# Patient Record
Sex: Female | Born: 1940
Health system: Southern US, Community
[De-identification: ages and names within clinical notes are randomized; demographics above are authoritative.]

## PROBLEM LIST (undated history)

## (undated) DIAGNOSIS — I4891 Unspecified atrial fibrillation: Secondary | ICD-10-CM

## (undated) DIAGNOSIS — I10 Essential (primary) hypertension: Secondary | ICD-10-CM

## (undated) DIAGNOSIS — I509 Heart failure, unspecified: Secondary | ICD-10-CM

## (undated) HISTORY — PX: FOOT SURGERY: SHX648

## (undated) HISTORY — PX: ABDOMINAL HYSTERECTOMY: SHX81

## (undated) HISTORY — PX: TUBAL LIGATION: SHX77

---

## 2004-03-26 ENCOUNTER — Ambulatory Visit: Payer: Self-pay | Admitting: Family Medicine

## 2004-04-23 ENCOUNTER — Ambulatory Visit: Payer: Self-pay | Admitting: Family Medicine

## 2004-05-22 ENCOUNTER — Ambulatory Visit: Payer: Self-pay | Admitting: Family Medicine

## 2004-05-29 ENCOUNTER — Ambulatory Visit: Payer: Self-pay | Admitting: Family Medicine

## 2004-06-06 ENCOUNTER — Ambulatory Visit: Payer: Self-pay | Admitting: Family Medicine

## 2004-07-07 ENCOUNTER — Ambulatory Visit: Payer: Self-pay | Admitting: Family Medicine

## 2004-07-14 ENCOUNTER — Ambulatory Visit: Payer: Self-pay | Admitting: Family Medicine

## 2004-07-21 ENCOUNTER — Ambulatory Visit: Payer: Self-pay | Admitting: Family Medicine

## 2004-07-28 ENCOUNTER — Ambulatory Visit: Payer: Self-pay | Admitting: Family Medicine

## 2004-08-11 ENCOUNTER — Ambulatory Visit: Payer: Self-pay | Admitting: Family Medicine

## 2004-08-18 ENCOUNTER — Ambulatory Visit: Payer: Self-pay | Admitting: Family Medicine

## 2004-09-15 ENCOUNTER — Ambulatory Visit: Payer: Self-pay | Admitting: Family Medicine

## 2004-09-30 ENCOUNTER — Ambulatory Visit: Payer: Self-pay | Admitting: Family Medicine

## 2004-10-14 ENCOUNTER — Ambulatory Visit: Payer: Self-pay | Admitting: Family Medicine

## 2004-10-17 ENCOUNTER — Ambulatory Visit: Payer: Self-pay | Admitting: Family Medicine

## 2004-10-24 ENCOUNTER — Ambulatory Visit: Payer: Self-pay | Admitting: Family Medicine

## 2004-11-20 ENCOUNTER — Ambulatory Visit: Payer: Self-pay | Admitting: Family Medicine

## 2004-11-27 ENCOUNTER — Ambulatory Visit: Payer: Self-pay | Admitting: Family Medicine

## 2004-12-11 ENCOUNTER — Ambulatory Visit: Payer: Self-pay | Admitting: Family Medicine

## 2004-12-24 ENCOUNTER — Ambulatory Visit: Payer: Self-pay | Admitting: Family Medicine

## 2005-01-12 ENCOUNTER — Ambulatory Visit: Payer: Self-pay | Admitting: Family Medicine

## 2005-01-26 ENCOUNTER — Ambulatory Visit: Payer: Self-pay | Admitting: Family Medicine

## 2005-02-27 ENCOUNTER — Ambulatory Visit: Payer: Self-pay | Admitting: Family Medicine

## 2005-03-04 ENCOUNTER — Ambulatory Visit: Payer: Self-pay | Admitting: Family Medicine

## 2005-03-19 ENCOUNTER — Ambulatory Visit: Payer: Self-pay | Admitting: Family Medicine

## 2005-04-02 ENCOUNTER — Ambulatory Visit: Payer: Self-pay | Admitting: Family Medicine

## 2005-04-16 ENCOUNTER — Ambulatory Visit: Payer: Self-pay | Admitting: Family Medicine

## 2005-05-08 ENCOUNTER — Ambulatory Visit: Payer: Self-pay | Admitting: Family Medicine

## 2005-06-09 ENCOUNTER — Ambulatory Visit: Payer: Self-pay | Admitting: Family Medicine

## 2005-06-24 ENCOUNTER — Ambulatory Visit: Payer: Self-pay | Admitting: Family Medicine

## 2005-07-14 ENCOUNTER — Ambulatory Visit: Payer: Self-pay | Admitting: Family Medicine

## 2005-08-12 ENCOUNTER — Ambulatory Visit: Payer: Self-pay | Admitting: Family Medicine

## 2011-05-23 DIAGNOSIS — J0100 Acute maxillary sinusitis, unspecified: Secondary | ICD-10-CM | POA: Diagnosis not present

## 2011-06-17 DIAGNOSIS — J370 Chronic laryngitis: Secondary | ICD-10-CM | POA: Diagnosis not present

## 2011-06-17 DIAGNOSIS — I951 Orthostatic hypotension: Secondary | ICD-10-CM | POA: Diagnosis not present

## 2011-06-24 DIAGNOSIS — Z1331 Encounter for screening for depression: Secondary | ICD-10-CM | POA: Diagnosis not present

## 2011-06-24 DIAGNOSIS — I951 Orthostatic hypotension: Secondary | ICD-10-CM | POA: Diagnosis not present

## 2011-07-06 DIAGNOSIS — Z0000 Encounter for general adult medical examination without abnormal findings: Secondary | ICD-10-CM | POA: Diagnosis not present

## 2011-07-16 DIAGNOSIS — D689 Coagulation defect, unspecified: Secondary | ICD-10-CM | POA: Diagnosis not present

## 2011-07-16 DIAGNOSIS — R002 Palpitations: Secondary | ICD-10-CM | POA: Diagnosis not present

## 2011-07-16 DIAGNOSIS — Z7901 Long term (current) use of anticoagulants: Secondary | ICD-10-CM | POA: Diagnosis not present

## 2011-07-16 DIAGNOSIS — I472 Ventricular tachycardia: Secondary | ICD-10-CM | POA: Diagnosis not present

## 2011-07-16 DIAGNOSIS — I4891 Unspecified atrial fibrillation: Secondary | ICD-10-CM | POA: Diagnosis not present

## 2011-07-16 DIAGNOSIS — E7800 Pure hypercholesterolemia, unspecified: Secondary | ICD-10-CM | POA: Diagnosis not present

## 2011-07-20 DIAGNOSIS — Z1211 Encounter for screening for malignant neoplasm of colon: Secondary | ICD-10-CM | POA: Diagnosis not present

## 2011-07-20 DIAGNOSIS — G479 Sleep disorder, unspecified: Secondary | ICD-10-CM | POA: Diagnosis not present

## 2011-07-20 DIAGNOSIS — M2550 Pain in unspecified joint: Secondary | ICD-10-CM | POA: Diagnosis not present

## 2011-07-20 DIAGNOSIS — Z13820 Encounter for screening for osteoporosis: Secondary | ICD-10-CM | POA: Diagnosis not present

## 2011-07-20 DIAGNOSIS — J209 Acute bronchitis, unspecified: Secondary | ICD-10-CM | POA: Diagnosis not present

## 2011-10-22 DIAGNOSIS — S50909A Unspecified superficial injury of unspecified elbow, initial encounter: Secondary | ICD-10-CM | POA: Diagnosis not present

## 2011-10-22 DIAGNOSIS — Z23 Encounter for immunization: Secondary | ICD-10-CM | POA: Diagnosis not present

## 2011-10-23 DIAGNOSIS — H35039 Hypertensive retinopathy, unspecified eye: Secondary | ICD-10-CM | POA: Diagnosis not present

## 2011-10-23 DIAGNOSIS — H40019 Open angle with borderline findings, low risk, unspecified eye: Secondary | ICD-10-CM | POA: Diagnosis not present

## 2011-10-23 DIAGNOSIS — H2510 Age-related nuclear cataract, unspecified eye: Secondary | ICD-10-CM | POA: Diagnosis not present

## 2012-01-21 DIAGNOSIS — I472 Ventricular tachycardia: Secondary | ICD-10-CM | POA: Diagnosis not present

## 2012-01-21 DIAGNOSIS — I4891 Unspecified atrial fibrillation: Secondary | ICD-10-CM | POA: Diagnosis not present

## 2012-03-07 DIAGNOSIS — R062 Wheezing: Secondary | ICD-10-CM | POA: Diagnosis not present

## 2012-03-07 DIAGNOSIS — J209 Acute bronchitis, unspecified: Secondary | ICD-10-CM | POA: Diagnosis not present

## 2012-03-07 DIAGNOSIS — J029 Acute pharyngitis, unspecified: Secondary | ICD-10-CM | POA: Diagnosis not present

## 2012-07-13 DIAGNOSIS — R1011 Right upper quadrant pain: Secondary | ICD-10-CM | POA: Diagnosis not present

## 2012-07-13 DIAGNOSIS — R5381 Other malaise: Secondary | ICD-10-CM | POA: Diagnosis not present

## 2012-07-13 DIAGNOSIS — I890 Lymphedema, not elsewhere classified: Secondary | ICD-10-CM | POA: Diagnosis not present

## 2012-08-03 DIAGNOSIS — R062 Wheezing: Secondary | ICD-10-CM | POA: Diagnosis not present

## 2012-08-08 DIAGNOSIS — M818 Other osteoporosis without current pathological fracture: Secondary | ICD-10-CM | POA: Diagnosis present

## 2012-08-08 DIAGNOSIS — Z886 Allergy status to analgesic agent status: Secondary | ICD-10-CM | POA: Diagnosis not present

## 2012-08-08 DIAGNOSIS — I509 Heart failure, unspecified: Secondary | ICD-10-CM | POA: Diagnosis not present

## 2012-08-08 DIAGNOSIS — I428 Other cardiomyopathies: Secondary | ICD-10-CM | POA: Diagnosis present

## 2012-08-08 DIAGNOSIS — Z79899 Other long term (current) drug therapy: Secondary | ICD-10-CM | POA: Diagnosis not present

## 2012-08-08 DIAGNOSIS — Z7982 Long term (current) use of aspirin: Secondary | ICD-10-CM | POA: Diagnosis not present

## 2012-08-08 DIAGNOSIS — E785 Hyperlipidemia, unspecified: Secondary | ICD-10-CM | POA: Diagnosis not present

## 2012-08-08 DIAGNOSIS — I059 Rheumatic mitral valve disease, unspecified: Secondary | ICD-10-CM | POA: Diagnosis not present

## 2012-08-08 DIAGNOSIS — I2510 Atherosclerotic heart disease of native coronary artery without angina pectoris: Secondary | ICD-10-CM | POA: Diagnosis not present

## 2012-08-08 DIAGNOSIS — I4891 Unspecified atrial fibrillation: Secondary | ICD-10-CM | POA: Diagnosis not present

## 2012-08-08 DIAGNOSIS — Z8249 Family history of ischemic heart disease and other diseases of the circulatory system: Secondary | ICD-10-CM | POA: Diagnosis not present

## 2012-08-08 DIAGNOSIS — E7800 Pure hypercholesterolemia, unspecified: Secondary | ICD-10-CM | POA: Diagnosis not present

## 2012-08-08 DIAGNOSIS — I501 Left ventricular failure: Secondary | ICD-10-CM | POA: Diagnosis not present

## 2012-08-10 DIAGNOSIS — Z79899 Other long term (current) drug therapy: Secondary | ICD-10-CM | POA: Diagnosis not present

## 2012-08-10 DIAGNOSIS — I2510 Atherosclerotic heart disease of native coronary artery without angina pectoris: Secondary | ICD-10-CM | POA: Diagnosis not present

## 2012-08-10 DIAGNOSIS — I509 Heart failure, unspecified: Secondary | ICD-10-CM | POA: Diagnosis not present

## 2012-08-10 DIAGNOSIS — I428 Other cardiomyopathies: Secondary | ICD-10-CM | POA: Diagnosis not present

## 2012-08-10 DIAGNOSIS — I059 Rheumatic mitral valve disease, unspecified: Secondary | ICD-10-CM | POA: Diagnosis not present

## 2012-08-10 DIAGNOSIS — I4891 Unspecified atrial fibrillation: Secondary | ICD-10-CM | POA: Diagnosis not present

## 2012-08-10 DIAGNOSIS — I501 Left ventricular failure: Secondary | ICD-10-CM | POA: Diagnosis not present

## 2012-08-11 DIAGNOSIS — I509 Heart failure, unspecified: Secondary | ICD-10-CM | POA: Diagnosis not present

## 2012-08-11 DIAGNOSIS — I2510 Atherosclerotic heart disease of native coronary artery without angina pectoris: Secondary | ICD-10-CM | POA: Diagnosis not present

## 2012-08-11 DIAGNOSIS — I428 Other cardiomyopathies: Secondary | ICD-10-CM | POA: Diagnosis not present

## 2012-08-11 DIAGNOSIS — I059 Rheumatic mitral valve disease, unspecified: Secondary | ICD-10-CM | POA: Diagnosis not present

## 2012-08-11 DIAGNOSIS — Z79899 Other long term (current) drug therapy: Secondary | ICD-10-CM | POA: Diagnosis not present

## 2012-08-11 DIAGNOSIS — I4891 Unspecified atrial fibrillation: Secondary | ICD-10-CM | POA: Diagnosis not present

## 2012-08-18 DIAGNOSIS — I428 Other cardiomyopathies: Secondary | ICD-10-CM | POA: Diagnosis not present

## 2012-08-18 DIAGNOSIS — D689 Coagulation defect, unspecified: Secondary | ICD-10-CM | POA: Diagnosis not present

## 2012-08-18 DIAGNOSIS — R002 Palpitations: Secondary | ICD-10-CM | POA: Diagnosis not present

## 2012-08-18 DIAGNOSIS — I4891 Unspecified atrial fibrillation: Secondary | ICD-10-CM | POA: Diagnosis not present

## 2012-08-29 DIAGNOSIS — M79609 Pain in unspecified limb: Secondary | ICD-10-CM | POA: Diagnosis not present

## 2012-08-29 DIAGNOSIS — I82819 Embolism and thrombosis of superficial veins of unspecified lower extremities: Secondary | ICD-10-CM | POA: Diagnosis not present

## 2012-09-21 DIAGNOSIS — I4891 Unspecified atrial fibrillation: Secondary | ICD-10-CM | POA: Diagnosis not present

## 2012-09-21 DIAGNOSIS — R002 Palpitations: Secondary | ICD-10-CM | POA: Diagnosis not present

## 2012-09-21 DIAGNOSIS — Z79899 Other long term (current) drug therapy: Secondary | ICD-10-CM | POA: Diagnosis not present

## 2012-09-21 DIAGNOSIS — Z7901 Long term (current) use of anticoagulants: Secondary | ICD-10-CM | POA: Diagnosis not present

## 2012-09-21 DIAGNOSIS — I428 Other cardiomyopathies: Secondary | ICD-10-CM | POA: Diagnosis not present

## 2013-01-25 DIAGNOSIS — I4891 Unspecified atrial fibrillation: Secondary | ICD-10-CM | POA: Diagnosis not present

## 2013-01-25 DIAGNOSIS — R002 Palpitations: Secondary | ICD-10-CM | POA: Diagnosis not present

## 2013-01-25 DIAGNOSIS — R52 Pain, unspecified: Secondary | ICD-10-CM | POA: Diagnosis not present

## 2013-01-25 DIAGNOSIS — I428 Other cardiomyopathies: Secondary | ICD-10-CM | POA: Diagnosis not present

## 2013-01-25 DIAGNOSIS — I472 Ventricular tachycardia: Secondary | ICD-10-CM | POA: Diagnosis not present

## 2013-02-06 DIAGNOSIS — Z1389 Encounter for screening for other disorder: Secondary | ICD-10-CM | POA: Diagnosis not present

## 2013-02-06 DIAGNOSIS — IMO0002 Reserved for concepts with insufficient information to code with codable children: Secondary | ICD-10-CM | POA: Diagnosis not present

## 2013-02-06 DIAGNOSIS — J0100 Acute maxillary sinusitis, unspecified: Secondary | ICD-10-CM | POA: Diagnosis not present

## 2013-02-10 ENCOUNTER — Emergency Department (HOSPITAL_COMMUNITY): Payer: No Typology Code available for payment source

## 2013-02-10 ENCOUNTER — Emergency Department (HOSPITAL_COMMUNITY)
Admission: EM | Admit: 2013-02-10 | Discharge: 2013-02-10 | Disposition: A | Discharge status: Home / Self Care | Payer: No Typology Code available for payment source | Attending: Emergency Medicine | Admitting: Emergency Medicine

## 2013-02-10 ENCOUNTER — Encounter (HOSPITAL_COMMUNITY): Payer: Self-pay | Admitting: Emergency Medicine

## 2013-02-10 DIAGNOSIS — Z79899 Other long term (current) drug therapy: Secondary | ICD-10-CM | POA: Insufficient documentation

## 2013-02-10 DIAGNOSIS — S0993XA Unspecified injury of face, initial encounter: Secondary | ICD-10-CM | POA: Diagnosis not present

## 2013-02-10 DIAGNOSIS — I4891 Unspecified atrial fibrillation: Secondary | ICD-10-CM | POA: Insufficient documentation

## 2013-02-10 DIAGNOSIS — S0990XA Unspecified injury of head, initial encounter: Secondary | ICD-10-CM | POA: Diagnosis not present

## 2013-02-10 DIAGNOSIS — Y9389 Activity, other specified: Secondary | ICD-10-CM | POA: Insufficient documentation

## 2013-02-10 DIAGNOSIS — Z7982 Long term (current) use of aspirin: Secondary | ICD-10-CM | POA: Insufficient documentation

## 2013-02-10 DIAGNOSIS — S139XXA Sprain of joints and ligaments of unspecified parts of neck, initial encounter: Secondary | ICD-10-CM | POA: Insufficient documentation

## 2013-02-10 DIAGNOSIS — S298XXA Other specified injuries of thorax, initial encounter: Secondary | ICD-10-CM | POA: Diagnosis not present

## 2013-02-10 DIAGNOSIS — Y92410 Unspecified street and highway as the place of occurrence of the external cause: Secondary | ICD-10-CM | POA: Insufficient documentation

## 2013-02-10 DIAGNOSIS — M542 Cervicalgia: Secondary | ICD-10-CM | POA: Diagnosis not present

## 2013-02-10 DIAGNOSIS — R51 Headache: Secondary | ICD-10-CM | POA: Diagnosis not present

## 2013-02-10 DIAGNOSIS — I10 Essential (primary) hypertension: Secondary | ICD-10-CM | POA: Insufficient documentation

## 2013-02-10 DIAGNOSIS — S161XXA Strain of muscle, fascia and tendon at neck level, initial encounter: Secondary | ICD-10-CM

## 2013-02-10 DIAGNOSIS — I509 Heart failure, unspecified: Secondary | ICD-10-CM | POA: Insufficient documentation

## 2013-02-10 HISTORY — DX: Essential (primary) hypertension: I10

## 2013-02-10 HISTORY — DX: Unspecified atrial fibrillation: I48.91

## 2013-02-10 HISTORY — DX: Heart failure, unspecified: I50.9
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
## 2013-03-23 DIAGNOSIS — H9190 Unspecified hearing loss, unspecified ear: Secondary | ICD-10-CM | POA: Diagnosis not present

## 2013-04-20 DIAGNOSIS — H905 Unspecified sensorineural hearing loss: Secondary | ICD-10-CM | POA: Diagnosis not present

## 2013-04-27 DIAGNOSIS — H35359 Cystoid macular degeneration, unspecified eye: Secondary | ICD-10-CM | POA: Diagnosis not present

## 2013-05-30 DIAGNOSIS — H35379 Puckering of macula, unspecified eye: Secondary | ICD-10-CM | POA: Diagnosis not present

## 2013-08-16 DIAGNOSIS — I428 Other cardiomyopathies: Secondary | ICD-10-CM | POA: Diagnosis not present

## 2013-08-16 DIAGNOSIS — I4891 Unspecified atrial fibrillation: Secondary | ICD-10-CM | POA: Diagnosis not present

## 2013-08-16 DIAGNOSIS — Z79899 Other long term (current) drug therapy: Secondary | ICD-10-CM | POA: Diagnosis not present

## 2013-08-16 DIAGNOSIS — I472 Ventricular tachycardia: Secondary | ICD-10-CM | POA: Diagnosis not present

## 2013-08-16 DIAGNOSIS — I4729 Other ventricular tachycardia: Secondary | ICD-10-CM | POA: Diagnosis not present

## 2013-08-29 DIAGNOSIS — H35379 Puckering of macula, unspecified eye: Secondary | ICD-10-CM | POA: Diagnosis not present

## 2013-09-01 DIAGNOSIS — I428 Other cardiomyopathies: Secondary | ICD-10-CM | POA: Diagnosis not present

## 2013-09-01 DIAGNOSIS — I4891 Unspecified atrial fibrillation: Secondary | ICD-10-CM | POA: Diagnosis not present

## 2013-09-20 DIAGNOSIS — R002 Palpitations: Secondary | ICD-10-CM | POA: Diagnosis not present

## 2013-09-20 DIAGNOSIS — I428 Other cardiomyopathies: Secondary | ICD-10-CM | POA: Diagnosis not present

## 2013-09-20 DIAGNOSIS — I4891 Unspecified atrial fibrillation: Secondary | ICD-10-CM | POA: Diagnosis not present

## 2013-09-26 DIAGNOSIS — L0390 Cellulitis, unspecified: Secondary | ICD-10-CM | POA: Diagnosis not present

## 2013-09-26 DIAGNOSIS — IMO0002 Reserved for concepts with insufficient information to code with codable children: Secondary | ICD-10-CM | POA: Diagnosis not present

## 2013-09-26 DIAGNOSIS — L0291 Cutaneous abscess, unspecified: Secondary | ICD-10-CM | POA: Diagnosis not present

## 2013-11-13 DIAGNOSIS — IMO0002 Reserved for concepts with insufficient information to code with codable children: Secondary | ICD-10-CM | POA: Diagnosis not present

## 2013-11-13 DIAGNOSIS — R1904 Left lower quadrant abdominal swelling, mass and lump: Secondary | ICD-10-CM | POA: Diagnosis not present

## 2013-11-16 DIAGNOSIS — Z9089 Acquired absence of other organs: Secondary | ICD-10-CM | POA: Diagnosis not present

## 2013-11-16 DIAGNOSIS — R109 Unspecified abdominal pain: Secondary | ICD-10-CM | POA: Diagnosis not present

## 2013-11-16 DIAGNOSIS — R1904 Left lower quadrant abdominal swelling, mass and lump: Secondary | ICD-10-CM | POA: Diagnosis not present

## 2013-11-20 DIAGNOSIS — Z1331 Encounter for screening for depression: Secondary | ICD-10-CM | POA: Diagnosis not present

## 2013-11-20 DIAGNOSIS — R1904 Left lower quadrant abdominal swelling, mass and lump: Secondary | ICD-10-CM | POA: Diagnosis not present

## 2013-11-20 DIAGNOSIS — Z139 Encounter for screening, unspecified: Secondary | ICD-10-CM | POA: Diagnosis not present

## 2013-11-20 DIAGNOSIS — Z1211 Encounter for screening for malignant neoplasm of colon: Secondary | ICD-10-CM | POA: Diagnosis not present

## 2014-01-02 DIAGNOSIS — Z1231 Encounter for screening mammogram for malignant neoplasm of breast: Secondary | ICD-10-CM | POA: Diagnosis not present

## 2014-01-02 DIAGNOSIS — IMO0002 Reserved for concepts with insufficient information to code with codable children: Secondary | ICD-10-CM | POA: Diagnosis not present

## 2014-01-02 DIAGNOSIS — E785 Hyperlipidemia, unspecified: Secondary | ICD-10-CM | POA: Diagnosis not present

## 2014-01-02 DIAGNOSIS — I428 Other cardiomyopathies: Secondary | ICD-10-CM | POA: Diagnosis not present

## 2014-01-02 DIAGNOSIS — I4891 Unspecified atrial fibrillation: Secondary | ICD-10-CM | POA: Diagnosis not present

## 2014-01-02 DIAGNOSIS — Z13220 Encounter for screening for lipoid disorders: Secondary | ICD-10-CM | POA: Diagnosis not present

## 2014-01-02 DIAGNOSIS — Z131 Encounter for screening for diabetes mellitus: Secondary | ICD-10-CM | POA: Diagnosis not present

## 2014-01-17 DIAGNOSIS — IMO0002 Reserved for concepts with insufficient information to code with codable children: Secondary | ICD-10-CM | POA: Diagnosis not present

## 2014-01-17 DIAGNOSIS — R1900 Intra-abdominal and pelvic swelling, mass and lump, unspecified site: Secondary | ICD-10-CM | POA: Diagnosis not present

## 2014-01-23 DIAGNOSIS — H2510 Age-related nuclear cataract, unspecified eye: Secondary | ICD-10-CM | POA: Diagnosis not present

## 2014-01-23 DIAGNOSIS — H04129 Dry eye syndrome of unspecified lacrimal gland: Secondary | ICD-10-CM | POA: Diagnosis not present

## 2014-01-23 DIAGNOSIS — H40019 Open angle with borderline findings, low risk, unspecified eye: Secondary | ICD-10-CM | POA: Diagnosis not present

## 2014-01-24 DIAGNOSIS — R1900 Intra-abdominal and pelvic swelling, mass and lump, unspecified site: Secondary | ICD-10-CM | POA: Diagnosis not present

## 2014-01-24 DIAGNOSIS — Z90710 Acquired absence of both cervix and uterus: Secondary | ICD-10-CM | POA: Diagnosis not present

## 2014-01-24 DIAGNOSIS — R1909 Other intra-abdominal and pelvic swelling, mass and lump: Secondary | ICD-10-CM | POA: Diagnosis not present

## 2014-01-31 DIAGNOSIS — R1904 Left lower quadrant abdominal swelling, mass and lump: Secondary | ICD-10-CM | POA: Diagnosis not present

## 2014-01-31 DIAGNOSIS — IMO0002 Reserved for concepts with insufficient information to code with codable children: Secondary | ICD-10-CM | POA: Diagnosis not present

## 2014-02-13 DIAGNOSIS — H2510 Age-related nuclear cataract, unspecified eye: Secondary | ICD-10-CM | POA: Diagnosis not present

## 2014-02-13 DIAGNOSIS — Z7982 Long term (current) use of aspirin: Secondary | ICD-10-CM | POA: Diagnosis not present

## 2014-02-13 DIAGNOSIS — H268 Other specified cataract: Secondary | ICD-10-CM | POA: Diagnosis not present

## 2014-02-13 DIAGNOSIS — Z79899 Other long term (current) drug therapy: Secondary | ICD-10-CM | POA: Diagnosis not present

## 2014-02-13 DIAGNOSIS — I4891 Unspecified atrial fibrillation: Secondary | ICD-10-CM | POA: Diagnosis not present

## 2014-02-13 DIAGNOSIS — I059 Rheumatic mitral valve disease, unspecified: Secondary | ICD-10-CM | POA: Diagnosis not present

## 2014-02-13 DIAGNOSIS — H259 Unspecified age-related cataract: Secondary | ICD-10-CM | POA: Diagnosis not present

## 2014-02-13 DIAGNOSIS — I509 Heart failure, unspecified: Secondary | ICD-10-CM | POA: Diagnosis not present

## 2014-02-14 DIAGNOSIS — H2589 Other age-related cataract: Secondary | ICD-10-CM | POA: Diagnosis not present

## 2014-02-26 DIAGNOSIS — R05 Cough: Secondary | ICD-10-CM | POA: Diagnosis not present

## 2014-02-26 DIAGNOSIS — R1904 Left lower quadrant abdominal swelling, mass and lump: Secondary | ICD-10-CM | POA: Diagnosis not present

## 2014-02-26 DIAGNOSIS — Z6822 Body mass index (BMI) 22.0-22.9, adult: Secondary | ICD-10-CM | POA: Diagnosis not present

## 2014-03-06 DIAGNOSIS — Z6822 Body mass index (BMI) 22.0-22.9, adult: Secondary | ICD-10-CM | POA: Diagnosis not present

## 2014-03-06 DIAGNOSIS — R05 Cough: Secondary | ICD-10-CM | POA: Diagnosis not present

## 2014-03-13 DIAGNOSIS — H02403 Unspecified ptosis of bilateral eyelids: Secondary | ICD-10-CM | POA: Diagnosis not present

## 2014-03-27 DIAGNOSIS — I509 Heart failure, unspecified: Secondary | ICD-10-CM | POA: Diagnosis not present

## 2014-03-27 DIAGNOSIS — H259 Unspecified age-related cataract: Secondary | ICD-10-CM | POA: Diagnosis not present

## 2014-03-27 DIAGNOSIS — H25812 Combined forms of age-related cataract, left eye: Secondary | ICD-10-CM | POA: Diagnosis not present

## 2014-03-27 DIAGNOSIS — I10 Essential (primary) hypertension: Secondary | ICD-10-CM | POA: Diagnosis not present

## 2014-03-27 DIAGNOSIS — Z7982 Long term (current) use of aspirin: Secondary | ICD-10-CM | POA: Diagnosis not present

## 2014-03-27 DIAGNOSIS — H2512 Age-related nuclear cataract, left eye: Secondary | ICD-10-CM | POA: Diagnosis not present

## 2014-03-27 DIAGNOSIS — E785 Hyperlipidemia, unspecified: Secondary | ICD-10-CM | POA: Diagnosis not present

## 2014-03-27 DIAGNOSIS — I4891 Unspecified atrial fibrillation: Secondary | ICD-10-CM | POA: Diagnosis not present

## 2014-03-27 DIAGNOSIS — Z79899 Other long term (current) drug therapy: Secondary | ICD-10-CM | POA: Diagnosis not present
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
## 2014-05-02 DIAGNOSIS — J029 Acute pharyngitis, unspecified: Secondary | ICD-10-CM | POA: Diagnosis not present

## 2014-05-02 DIAGNOSIS — Z6822 Body mass index (BMI) 22.0-22.9, adult: Secondary | ICD-10-CM | POA: Diagnosis not present

## 2014-07-09 DIAGNOSIS — H02403 Unspecified ptosis of bilateral eyelids: Secondary | ICD-10-CM | POA: Diagnosis not present

## 2014-07-20 DIAGNOSIS — H16203 Unspecified keratoconjunctivitis, bilateral: Secondary | ICD-10-CM | POA: Diagnosis not present

## 2014-08-02 DIAGNOSIS — H16299 Other keratoconjunctivitis, unspecified eye: Secondary | ICD-10-CM | POA: Diagnosis not present

## 2014-08-07 DIAGNOSIS — E785 Hyperlipidemia, unspecified: Secondary | ICD-10-CM | POA: Diagnosis not present

## 2014-08-07 DIAGNOSIS — I428 Other cardiomyopathies: Secondary | ICD-10-CM | POA: Diagnosis not present

## 2014-08-07 DIAGNOSIS — I480 Paroxysmal atrial fibrillation: Secondary | ICD-10-CM | POA: Diagnosis not present

## 2014-08-07 DIAGNOSIS — I429 Cardiomyopathy, unspecified: Secondary | ICD-10-CM | POA: Diagnosis not present

## 2014-08-16 DIAGNOSIS — I361 Nonrheumatic tricuspid (valve) insufficiency: Secondary | ICD-10-CM | POA: Diagnosis not present

## 2014-08-16 DIAGNOSIS — I340 Nonrheumatic mitral (valve) insufficiency: Secondary | ICD-10-CM | POA: Diagnosis not present

## 2014-08-16 DIAGNOSIS — I429 Cardiomyopathy, unspecified: Secondary | ICD-10-CM | POA: Diagnosis not present

## 2014-09-17 DIAGNOSIS — H10413 Chronic giant papillary conjunctivitis, bilateral: Secondary | ICD-10-CM | POA: Diagnosis not present

## 2014-09-17 DIAGNOSIS — H04123 Dry eye syndrome of bilateral lacrimal glands: Secondary | ICD-10-CM | POA: Diagnosis not present

## 2014-09-17 DIAGNOSIS — Z961 Presence of intraocular lens: Secondary | ICD-10-CM | POA: Diagnosis not present

## 2014-09-22 DIAGNOSIS — T25122A Burn of first degree of left foot, initial encounter: Secondary | ICD-10-CM | POA: Diagnosis not present

## 2014-09-26 DIAGNOSIS — Z6822 Body mass index (BMI) 22.0-22.9, adult: Secondary | ICD-10-CM | POA: Diagnosis not present

## 2014-09-26 DIAGNOSIS — T25222S Burn of second degree of left foot, sequela: Secondary | ICD-10-CM | POA: Diagnosis not present

## 2014-10-22 DIAGNOSIS — Z6822 Body mass index (BMI) 22.0-22.9, adult: Secondary | ICD-10-CM | POA: Diagnosis not present

## 2014-10-22 DIAGNOSIS — H04123 Dry eye syndrome of bilateral lacrimal glands: Secondary | ICD-10-CM | POA: Diagnosis not present

## 2014-10-22 DIAGNOSIS — J029 Acute pharyngitis, unspecified: Secondary | ICD-10-CM | POA: Diagnosis not present

## 2014-10-22 DIAGNOSIS — Z961 Presence of intraocular lens: Secondary | ICD-10-CM | POA: Diagnosis not present

## 2014-10-22 DIAGNOSIS — H11823 Conjunctivochalasis, bilateral: Secondary | ICD-10-CM | POA: Diagnosis not present

## 2014-10-22 DIAGNOSIS — H35343 Macular cyst, hole, or pseudohole, bilateral: Secondary | ICD-10-CM | POA: Diagnosis not present

## 2014-10-26 DIAGNOSIS — H35359 Cystoid macular degeneration, unspecified eye: Secondary | ICD-10-CM | POA: Diagnosis not present

## 2014-10-26 DIAGNOSIS — H35353 Cystoid macular degeneration, bilateral: Secondary | ICD-10-CM | POA: Diagnosis not present

## 2014-10-26 DIAGNOSIS — G4730 Sleep apnea, unspecified: Secondary | ICD-10-CM | POA: Diagnosis not present

## 2014-10-26 DIAGNOSIS — H35079 Retinal telangiectasis, unspecified eye: Secondary | ICD-10-CM | POA: Diagnosis not present

## 2014-11-16 DIAGNOSIS — R0683 Snoring: Secondary | ICD-10-CM | POA: Diagnosis not present

## 2015-01-15 DIAGNOSIS — H35359 Cystoid macular degeneration, unspecified eye: Secondary | ICD-10-CM | POA: Diagnosis not present

## 2015-01-15 DIAGNOSIS — H35079 Retinal telangiectasis, unspecified eye: Secondary | ICD-10-CM | POA: Diagnosis not present

## 2015-02-19 DIAGNOSIS — Z1389 Encounter for screening for other disorder: Secondary | ICD-10-CM | POA: Diagnosis not present

## 2015-02-19 DIAGNOSIS — Z139 Encounter for screening, unspecified: Secondary | ICD-10-CM | POA: Diagnosis not present

## 2015-02-19 DIAGNOSIS — Z0000 Encounter for general adult medical examination without abnormal findings: Secondary | ICD-10-CM | POA: Diagnosis not present

## 2015-02-19 DIAGNOSIS — Z9181 History of falling: Secondary | ICD-10-CM | POA: Diagnosis not present

## 2015-03-06 DIAGNOSIS — S60463A Insect bite (nonvenomous) of left middle finger, initial encounter: Secondary | ICD-10-CM | POA: Diagnosis not present

## 2015-03-06 DIAGNOSIS — L0390 Cellulitis, unspecified: Secondary | ICD-10-CM | POA: Diagnosis not present

## 2015-03-31 DIAGNOSIS — J029 Acute pharyngitis, unspecified: Secondary | ICD-10-CM | POA: Diagnosis not present

## 2015-04-18 DIAGNOSIS — Z1211 Encounter for screening for malignant neoplasm of colon: Secondary | ICD-10-CM | POA: Diagnosis not present

## 2015-04-18 DIAGNOSIS — Z780 Asymptomatic menopausal state: Secondary | ICD-10-CM | POA: Diagnosis not present

## 2015-04-18 DIAGNOSIS — Z1329 Encounter for screening for other suspected endocrine disorder: Secondary | ICD-10-CM | POA: Diagnosis not present

## 2015-04-18 DIAGNOSIS — Z1231 Encounter for screening mammogram for malignant neoplasm of breast: Secondary | ICD-10-CM | POA: Diagnosis not present

## 2015-04-18 DIAGNOSIS — E785 Hyperlipidemia, unspecified: Secondary | ICD-10-CM | POA: Diagnosis not present

## 2015-05-08 DIAGNOSIS — Z961 Presence of intraocular lens: Secondary | ICD-10-CM | POA: Diagnosis not present

## 2015-05-08 DIAGNOSIS — H04123 Dry eye syndrome of bilateral lacrimal glands: Secondary | ICD-10-CM | POA: Diagnosis not present

## 2015-05-15 DIAGNOSIS — M8589 Other specified disorders of bone density and structure, multiple sites: Secondary | ICD-10-CM | POA: Diagnosis not present

## 2015-05-15 DIAGNOSIS — Z780 Asymptomatic menopausal state: Secondary | ICD-10-CM | POA: Diagnosis not present

## 2015-05-15 DIAGNOSIS — Z1231 Encounter for screening mammogram for malignant neoplasm of breast: Secondary | ICD-10-CM | POA: Diagnosis not present

## 2015-07-09 DIAGNOSIS — H40003 Preglaucoma, unspecified, bilateral: Secondary | ICD-10-CM | POA: Diagnosis not present

## 2015-09-09 DIAGNOSIS — Z6822 Body mass index (BMI) 22.0-22.9, adult: Secondary | ICD-10-CM | POA: Diagnosis not present

## 2015-09-09 DIAGNOSIS — K5909 Other constipation: Secondary | ICD-10-CM | POA: Diagnosis not present

## 2015-09-09 DIAGNOSIS — R102 Pelvic and perineal pain: Secondary | ICD-10-CM | POA: Diagnosis not present

## 2015-10-08 DIAGNOSIS — R102 Pelvic and perineal pain: Secondary | ICD-10-CM | POA: Diagnosis not present

## 2015-10-15 DIAGNOSIS — Z961 Presence of intraocular lens: Secondary | ICD-10-CM | POA: Diagnosis not present

## 2015-10-15 DIAGNOSIS — H35353 Cystoid macular degeneration, bilateral: Secondary | ICD-10-CM | POA: Diagnosis not present

## 2015-10-15 DIAGNOSIS — H35079 Retinal telangiectasis, unspecified eye: Secondary | ICD-10-CM | POA: Diagnosis not present

## 2015-10-15 DIAGNOSIS — H35359 Cystoid macular degeneration, unspecified eye: Secondary | ICD-10-CM | POA: Diagnosis not present

## 2015-10-15 DIAGNOSIS — R0683 Snoring: Secondary | ICD-10-CM | POA: Diagnosis not present

## 2015-10-24 DIAGNOSIS — R102 Pelvic and perineal pain: Secondary | ICD-10-CM | POA: Diagnosis not present

## 2015-10-24 DIAGNOSIS — G8929 Other chronic pain: Secondary | ICD-10-CM | POA: Diagnosis not present

## 2015-10-24 DIAGNOSIS — M545 Low back pain: Secondary | ICD-10-CM | POA: Diagnosis not present

## 2015-11-12 DIAGNOSIS — I429 Cardiomyopathy, unspecified: Secondary | ICD-10-CM | POA: Insufficient documentation

## 2015-11-12 DIAGNOSIS — I480 Paroxysmal atrial fibrillation: Secondary | ICD-10-CM

## 2015-11-12 HISTORY — DX: Paroxysmal atrial fibrillation: I48.0

## 2015-11-12 HISTORY — DX: Cardiomyopathy, unspecified: I42.9

## 2015-11-25 DIAGNOSIS — I480 Paroxysmal atrial fibrillation: Secondary | ICD-10-CM | POA: Diagnosis not present

## 2015-11-25 DIAGNOSIS — I429 Cardiomyopathy, unspecified: Secondary | ICD-10-CM | POA: Diagnosis not present

## 2015-12-10 DIAGNOSIS — R350 Frequency of micturition: Secondary | ICD-10-CM | POA: Diagnosis not present

## 2015-12-10 DIAGNOSIS — R8271 Bacteriuria: Secondary | ICD-10-CM | POA: Diagnosis not present

## 2015-12-10 DIAGNOSIS — Z6822 Body mass index (BMI) 22.0-22.9, adult: Secondary | ICD-10-CM | POA: Diagnosis not present

## 2016-02-03 DIAGNOSIS — Z6822 Body mass index (BMI) 22.0-22.9, adult: Secondary | ICD-10-CM | POA: Diagnosis not present

## 2016-02-03 DIAGNOSIS — J329 Chronic sinusitis, unspecified: Secondary | ICD-10-CM | POA: Diagnosis not present

## 2016-04-29 DIAGNOSIS — Z6822 Body mass index (BMI) 22.0-22.9, adult: Secondary | ICD-10-CM | POA: Diagnosis not present

## 2016-04-29 DIAGNOSIS — L03115 Cellulitis of right lower limb: Secondary | ICD-10-CM | POA: Diagnosis not present

## 2016-05-28 DIAGNOSIS — I420 Dilated cardiomyopathy: Secondary | ICD-10-CM | POA: Diagnosis not present

## 2016-05-28 DIAGNOSIS — E784 Other hyperlipidemia: Secondary | ICD-10-CM | POA: Diagnosis not present

## 2016-05-28 DIAGNOSIS — I480 Paroxysmal atrial fibrillation: Secondary | ICD-10-CM | POA: Diagnosis not present

## 2016-05-30 DIAGNOSIS — L0390 Cellulitis, unspecified: Secondary | ICD-10-CM | POA: Diagnosis not present

## 2016-07-03 DIAGNOSIS — Z6821 Body mass index (BMI) 21.0-21.9, adult: Secondary | ICD-10-CM | POA: Diagnosis not present

## 2016-07-03 DIAGNOSIS — L03115 Cellulitis of right lower limb: Secondary | ICD-10-CM | POA: Diagnosis not present

## 2016-07-16 DIAGNOSIS — Z139 Encounter for screening, unspecified: Secondary | ICD-10-CM | POA: Diagnosis not present

## 2016-07-16 DIAGNOSIS — J309 Allergic rhinitis, unspecified: Secondary | ICD-10-CM | POA: Diagnosis not present

## 2016-07-16 DIAGNOSIS — M2550 Pain in unspecified joint: Secondary | ICD-10-CM | POA: Diagnosis not present

## 2016-07-16 DIAGNOSIS — L03115 Cellulitis of right lower limb: Secondary | ICD-10-CM | POA: Diagnosis not present

## 2016-07-16 DIAGNOSIS — Z1389 Encounter for screening for other disorder: Secondary | ICD-10-CM | POA: Diagnosis not present

## 2016-07-16 DIAGNOSIS — I4891 Unspecified atrial fibrillation: Secondary | ICD-10-CM | POA: Diagnosis not present

## 2016-07-16 DIAGNOSIS — Z0000 Encounter for general adult medical examination without abnormal findings: Secondary | ICD-10-CM | POA: Diagnosis not present

## 2016-07-16 DIAGNOSIS — Z5181 Encounter for therapeutic drug level monitoring: Secondary | ICD-10-CM | POA: Diagnosis not present

## 2016-07-23 DIAGNOSIS — Z6821 Body mass index (BMI) 21.0-21.9, adult: Secondary | ICD-10-CM | POA: Diagnosis not present

## 2016-07-23 DIAGNOSIS — M2041 Other hammer toe(s) (acquired), right foot: Secondary | ICD-10-CM | POA: Diagnosis not present

## 2016-07-23 DIAGNOSIS — L280 Lichen simplex chronicus: Secondary | ICD-10-CM | POA: Diagnosis not present

## 2016-07-23 DIAGNOSIS — J309 Allergic rhinitis, unspecified: Secondary | ICD-10-CM | POA: Diagnosis not present

## 2016-08-03 DIAGNOSIS — Z6822 Body mass index (BMI) 22.0-22.9, adult: Secondary | ICD-10-CM | POA: Diagnosis not present

## 2016-08-03 DIAGNOSIS — L84 Corns and callosities: Secondary | ICD-10-CM | POA: Diagnosis not present

## 2016-08-03 DIAGNOSIS — M79671 Pain in right foot: Secondary | ICD-10-CM | POA: Diagnosis not present

## 2016-08-18 DIAGNOSIS — M7741 Metatarsalgia, right foot: Secondary | ICD-10-CM

## 2016-08-18 DIAGNOSIS — M2041 Other hammer toe(s) (acquired), right foot: Secondary | ICD-10-CM

## 2016-08-18 DIAGNOSIS — M89371 Hypertrophy of bone, right ankle and foot: Secondary | ICD-10-CM | POA: Diagnosis not present

## 2016-08-18 HISTORY — DX: Other hammer toe(s) (acquired), right foot: M20.41

## 2016-08-18 HISTORY — DX: Metatarsalgia, right foot: M77.41

## 2016-08-21 DIAGNOSIS — I429 Cardiomyopathy, unspecified: Secondary | ICD-10-CM | POA: Diagnosis not present

## 2016-08-21 DIAGNOSIS — I4891 Unspecified atrial fibrillation: Secondary | ICD-10-CM | POA: Diagnosis not present

## 2016-08-21 DIAGNOSIS — M79671 Pain in right foot: Secondary | ICD-10-CM | POA: Diagnosis not present

## 2016-08-21 DIAGNOSIS — Z01818 Encounter for other preprocedural examination: Secondary | ICD-10-CM | POA: Diagnosis not present

## 2016-08-26 DIAGNOSIS — Z79899 Other long term (current) drug therapy: Secondary | ICD-10-CM | POA: Diagnosis not present

## 2016-08-26 DIAGNOSIS — I480 Paroxysmal atrial fibrillation: Secondary | ICD-10-CM | POA: Diagnosis not present

## 2016-08-26 DIAGNOSIS — I420 Dilated cardiomyopathy: Secondary | ICD-10-CM | POA: Diagnosis not present

## 2016-08-26 DIAGNOSIS — Z01810 Encounter for preprocedural cardiovascular examination: Secondary | ICD-10-CM | POA: Diagnosis not present

## 2016-09-01 DIAGNOSIS — M89371 Hypertrophy of bone, right ankle and foot: Secondary | ICD-10-CM | POA: Diagnosis not present

## 2016-09-01 DIAGNOSIS — M2041 Other hammer toe(s) (acquired), right foot: Secondary | ICD-10-CM | POA: Diagnosis not present

## 2016-09-10 DIAGNOSIS — M89371 Hypertrophy of bone, right ankle and foot: Secondary | ICD-10-CM | POA: Diagnosis not present

## 2016-09-10 DIAGNOSIS — M2041 Other hammer toe(s) (acquired), right foot: Secondary | ICD-10-CM | POA: Diagnosis not present

## 2016-09-10 DIAGNOSIS — I4891 Unspecified atrial fibrillation: Secondary | ICD-10-CM | POA: Diagnosis not present

## 2016-09-10 DIAGNOSIS — Z79899 Other long term (current) drug therapy: Secondary | ICD-10-CM | POA: Diagnosis not present

## 2016-10-04 DIAGNOSIS — M545 Low back pain: Secondary | ICD-10-CM | POA: Diagnosis not present

## 2016-10-04 DIAGNOSIS — N3090 Cystitis, unspecified without hematuria: Secondary | ICD-10-CM | POA: Diagnosis not present

## 2016-11-05 DIAGNOSIS — L03115 Cellulitis of right lower limb: Secondary | ICD-10-CM | POA: Diagnosis not present

## 2016-11-05 DIAGNOSIS — S90811A Abrasion, right foot, initial encounter: Secondary | ICD-10-CM | POA: Diagnosis not present

## 2016-11-17 DIAGNOSIS — S90811D Abrasion, right foot, subsequent encounter: Secondary | ICD-10-CM | POA: Diagnosis not present

## 2016-12-04 ENCOUNTER — Other Ambulatory Visit: Payer: Self-pay

## 2016-12-14 DIAGNOSIS — H3561 Retinal hemorrhage, right eye: Secondary | ICD-10-CM | POA: Diagnosis not present

## 2016-12-14 DIAGNOSIS — H35359 Cystoid macular degeneration, unspecified eye: Secondary | ICD-10-CM | POA: Diagnosis not present

## 2016-12-14 DIAGNOSIS — H35079 Retinal telangiectasis, unspecified eye: Secondary | ICD-10-CM | POA: Diagnosis not present

## 2016-12-30 DIAGNOSIS — R0683 Snoring: Secondary | ICD-10-CM | POA: Diagnosis not present

## 2016-12-30 DIAGNOSIS — H35079 Retinal telangiectasis, unspecified eye: Secondary | ICD-10-CM | POA: Diagnosis not present

## 2016-12-30 DIAGNOSIS — I4891 Unspecified atrial fibrillation: Secondary | ICD-10-CM | POA: Diagnosis not present

## 2016-12-30 DIAGNOSIS — H35353 Cystoid macular degeneration, bilateral: Secondary | ICD-10-CM | POA: Diagnosis not present

## 2016-12-30 DIAGNOSIS — H35359 Cystoid macular degeneration, unspecified eye: Secondary | ICD-10-CM | POA: Diagnosis not present

## 2017-02-26 DIAGNOSIS — L2089 Other atopic dermatitis: Secondary | ICD-10-CM | POA: Diagnosis not present

## 2017-03-10 DIAGNOSIS — Z13220 Encounter for screening for lipoid disorders: Secondary | ICD-10-CM | POA: Diagnosis not present

## 2017-03-10 DIAGNOSIS — Z139 Encounter for screening, unspecified: Secondary | ICD-10-CM | POA: Diagnosis not present

## 2017-03-10 DIAGNOSIS — Z1211 Encounter for screening for malignant neoplasm of colon: Secondary | ICD-10-CM | POA: Diagnosis not present

## 2017-03-10 DIAGNOSIS — Z7189 Other specified counseling: Secondary | ICD-10-CM | POA: Diagnosis not present

## 2017-03-10 DIAGNOSIS — Z6822 Body mass index (BMI) 22.0-22.9, adult: Secondary | ICD-10-CM | POA: Diagnosis not present

## 2017-03-10 DIAGNOSIS — Z0000 Encounter for general adult medical examination without abnormal findings: Secondary | ICD-10-CM | POA: Diagnosis not present

## 2017-03-10 DIAGNOSIS — Z131 Encounter for screening for diabetes mellitus: Secondary | ICD-10-CM | POA: Diagnosis not present

## 2017-03-10 DIAGNOSIS — Z1331 Encounter for screening for depression: Secondary | ICD-10-CM | POA: Diagnosis not present

## 2017-03-14 DIAGNOSIS — J0100 Acute maxillary sinusitis, unspecified: Secondary | ICD-10-CM | POA: Diagnosis not present

## 2017-03-30 DIAGNOSIS — R7301 Impaired fasting glucose: Secondary | ICD-10-CM | POA: Diagnosis not present

## 2017-04-04 DIAGNOSIS — Z23 Encounter for immunization: Secondary | ICD-10-CM | POA: Diagnosis not present

## 2017-05-05 DIAGNOSIS — J3089 Other allergic rhinitis: Secondary | ICD-10-CM | POA: Diagnosis not present

## 2017-05-05 DIAGNOSIS — I4891 Unspecified atrial fibrillation: Secondary | ICD-10-CM | POA: Diagnosis not present

## 2017-05-05 DIAGNOSIS — R7303 Prediabetes: Secondary | ICD-10-CM | POA: Diagnosis not present

## 2017-05-05 DIAGNOSIS — J3081 Allergic rhinitis due to animal (cat) (dog) hair and dander: Secondary | ICD-10-CM | POA: Diagnosis not present

## 2017-05-05 DIAGNOSIS — I429 Cardiomyopathy, unspecified: Secondary | ICD-10-CM | POA: Diagnosis not present

## 2017-05-05 DIAGNOSIS — J301 Allergic rhinitis due to pollen: Secondary | ICD-10-CM | POA: Diagnosis not present

## 2017-05-05 DIAGNOSIS — Z6822 Body mass index (BMI) 22.0-22.9, adult: Secondary | ICD-10-CM | POA: Diagnosis not present

## 2017-05-06 DIAGNOSIS — J301 Allergic rhinitis due to pollen: Secondary | ICD-10-CM | POA: Diagnosis not present

## 2017-05-06 DIAGNOSIS — J3089 Other allergic rhinitis: Secondary | ICD-10-CM | POA: Diagnosis not present

## 2017-05-06 DIAGNOSIS — J3081 Allergic rhinitis due to animal (cat) (dog) hair and dander: Secondary | ICD-10-CM | POA: Diagnosis not present

## 2017-05-07 DIAGNOSIS — I428 Other cardiomyopathies: Secondary | ICD-10-CM | POA: Diagnosis not present

## 2017-05-07 DIAGNOSIS — J3089 Other allergic rhinitis: Secondary | ICD-10-CM | POA: Diagnosis not present

## 2017-05-07 DIAGNOSIS — J301 Allergic rhinitis due to pollen: Secondary | ICD-10-CM | POA: Diagnosis not present

## 2017-05-07 DIAGNOSIS — I480 Paroxysmal atrial fibrillation: Secondary | ICD-10-CM | POA: Diagnosis not present

## 2017-05-07 DIAGNOSIS — I482 Chronic atrial fibrillation: Secondary | ICD-10-CM | POA: Diagnosis not present

## 2017-05-07 DIAGNOSIS — I4891 Unspecified atrial fibrillation: Secondary | ICD-10-CM | POA: Diagnosis not present

## 2017-05-07 DIAGNOSIS — I5020 Unspecified systolic (congestive) heart failure: Secondary | ICD-10-CM | POA: Diagnosis not present

## 2017-05-07 DIAGNOSIS — J3081 Allergic rhinitis due to animal (cat) (dog) hair and dander: Secondary | ICD-10-CM | POA: Diagnosis not present

## 2017-05-10 DIAGNOSIS — J3081 Allergic rhinitis due to animal (cat) (dog) hair and dander: Secondary | ICD-10-CM | POA: Diagnosis not present

## 2017-05-10 DIAGNOSIS — J301 Allergic rhinitis due to pollen: Secondary | ICD-10-CM | POA: Diagnosis not present

## 2017-05-10 DIAGNOSIS — J3089 Other allergic rhinitis: Secondary | ICD-10-CM | POA: Diagnosis not present

## 2017-05-12 DIAGNOSIS — J3081 Allergic rhinitis due to animal (cat) (dog) hair and dander: Secondary | ICD-10-CM | POA: Diagnosis not present

## 2017-05-12 DIAGNOSIS — J3089 Other allergic rhinitis: Secondary | ICD-10-CM | POA: Diagnosis not present

## 2017-05-12 DIAGNOSIS — J301 Allergic rhinitis due to pollen: Secondary | ICD-10-CM | POA: Diagnosis not present

## 2017-05-13 DIAGNOSIS — J3089 Other allergic rhinitis: Secondary | ICD-10-CM | POA: Diagnosis not present

## 2017-05-13 DIAGNOSIS — J301 Allergic rhinitis due to pollen: Secondary | ICD-10-CM | POA: Diagnosis not present

## 2017-05-13 DIAGNOSIS — J3081 Allergic rhinitis due to animal (cat) (dog) hair and dander: Secondary | ICD-10-CM | POA: Diagnosis not present

## 2017-05-14 DIAGNOSIS — J301 Allergic rhinitis due to pollen: Secondary | ICD-10-CM | POA: Diagnosis not present

## 2017-05-14 DIAGNOSIS — J3089 Other allergic rhinitis: Secondary | ICD-10-CM | POA: Diagnosis not present

## 2017-05-14 DIAGNOSIS — J3081 Allergic rhinitis due to animal (cat) (dog) hair and dander: Secondary | ICD-10-CM | POA: Diagnosis not present

## 2017-05-15 DIAGNOSIS — J0100 Acute maxillary sinusitis, unspecified: Secondary | ICD-10-CM | POA: Diagnosis not present

## 2017-05-15 DIAGNOSIS — J029 Acute pharyngitis, unspecified: Secondary | ICD-10-CM | POA: Diagnosis not present

## 2017-05-17 DIAGNOSIS — J301 Allergic rhinitis due to pollen: Secondary | ICD-10-CM | POA: Diagnosis not present

## 2017-05-17 DIAGNOSIS — J3089 Other allergic rhinitis: Secondary | ICD-10-CM | POA: Diagnosis not present

## 2017-05-17 DIAGNOSIS — J3081 Allergic rhinitis due to animal (cat) (dog) hair and dander: Secondary | ICD-10-CM | POA: Diagnosis not present

## 2017-07-06 DIAGNOSIS — Z1211 Encounter for screening for malignant neoplasm of colon: Secondary | ICD-10-CM | POA: Diagnosis not present

## 2017-07-06 DIAGNOSIS — K5900 Constipation, unspecified: Secondary | ICD-10-CM | POA: Diagnosis not present

## 2017-07-08 DIAGNOSIS — R350 Frequency of micturition: Secondary | ICD-10-CM | POA: Diagnosis not present

## 2017-07-08 DIAGNOSIS — N898 Other specified noninflammatory disorders of vagina: Secondary | ICD-10-CM | POA: Diagnosis not present

## 2017-07-08 DIAGNOSIS — Z6822 Body mass index (BMI) 22.0-22.9, adult: Secondary | ICD-10-CM | POA: Diagnosis not present

## 2017-07-09 DIAGNOSIS — R350 Frequency of micturition: Secondary | ICD-10-CM | POA: Diagnosis not present

## 2017-07-19 DIAGNOSIS — I4891 Unspecified atrial fibrillation: Secondary | ICD-10-CM | POA: Diagnosis not present

## 2017-07-19 DIAGNOSIS — I429 Cardiomyopathy, unspecified: Secondary | ICD-10-CM | POA: Diagnosis not present

## 2017-07-19 DIAGNOSIS — I110 Hypertensive heart disease with heart failure: Secondary | ICD-10-CM | POA: Diagnosis not present

## 2017-07-19 DIAGNOSIS — Z1211 Encounter for screening for malignant neoplasm of colon: Secondary | ICD-10-CM | POA: Diagnosis not present

## 2017-07-19 DIAGNOSIS — Z7982 Long term (current) use of aspirin: Secondary | ICD-10-CM | POA: Diagnosis not present

## 2017-07-19 DIAGNOSIS — Z79899 Other long term (current) drug therapy: Secondary | ICD-10-CM | POA: Diagnosis not present

## 2017-07-19 DIAGNOSIS — K648 Other hemorrhoids: Secondary | ICD-10-CM | POA: Diagnosis not present

## 2017-07-19 DIAGNOSIS — I509 Heart failure, unspecified: Secondary | ICD-10-CM | POA: Diagnosis not present

## 2017-07-19 DIAGNOSIS — K5730 Diverticulosis of large intestine without perforation or abscess without bleeding: Secondary | ICD-10-CM | POA: Diagnosis not present

## 2017-09-27 DIAGNOSIS — B86 Scabies: Secondary | ICD-10-CM | POA: Diagnosis not present

## 2017-11-08 ENCOUNTER — Other Ambulatory Visit: Payer: Self-pay

## 2017-11-08 DIAGNOSIS — Z1211 Encounter for screening for malignant neoplasm of colon: Secondary | ICD-10-CM

## 2017-11-28 DIAGNOSIS — J0100 Acute maxillary sinusitis, unspecified: Secondary | ICD-10-CM | POA: Diagnosis not present

## 2017-12-10 DIAGNOSIS — R600 Localized edema: Secondary | ICD-10-CM | POA: Diagnosis not present

## 2017-12-10 DIAGNOSIS — M79671 Pain in right foot: Secondary | ICD-10-CM | POA: Diagnosis not present

## 2017-12-12 DIAGNOSIS — S5010XA Contusion of unspecified forearm, initial encounter: Secondary | ICD-10-CM | POA: Diagnosis not present

## 2017-12-12 DIAGNOSIS — S50819A Abrasion of unspecified forearm, initial encounter: Secondary | ICD-10-CM | POA: Diagnosis not present

## 2017-12-14 DIAGNOSIS — M25571 Pain in right ankle and joints of right foot: Secondary | ICD-10-CM

## 2017-12-14 DIAGNOSIS — Z1389 Encounter for screening for other disorder: Secondary | ICD-10-CM | POA: Insufficient documentation

## 2017-12-14 DIAGNOSIS — M7741 Metatarsalgia, right foot: Secondary | ICD-10-CM | POA: Diagnosis not present

## 2017-12-14 HISTORY — DX: Pain in right ankle and joints of right foot: M25.571

## 2017-12-14 HISTORY — DX: Encounter for screening for other disorder: Z13.89

## 2018-01-10 DIAGNOSIS — N3090 Cystitis, unspecified without hematuria: Secondary | ICD-10-CM | POA: Diagnosis not present

## 2018-01-10 DIAGNOSIS — N3001 Acute cystitis with hematuria: Secondary | ICD-10-CM | POA: Diagnosis not present

## 2018-02-22 DIAGNOSIS — M7751 Other enthesopathy of right foot: Secondary | ICD-10-CM

## 2018-02-22 DIAGNOSIS — M2041 Other hammer toe(s) (acquired), right foot: Secondary | ICD-10-CM | POA: Diagnosis not present

## 2018-02-22 HISTORY — DX: Other enthesopathy of right foot and ankle: M77.51

## 2018-02-28 DIAGNOSIS — E785 Hyperlipidemia, unspecified: Secondary | ICD-10-CM | POA: Diagnosis not present

## 2018-02-28 DIAGNOSIS — R7303 Prediabetes: Secondary | ICD-10-CM | POA: Diagnosis not present

## 2018-03-07 DIAGNOSIS — R7303 Prediabetes: Secondary | ICD-10-CM | POA: Diagnosis not present

## 2018-03-07 DIAGNOSIS — I429 Cardiomyopathy, unspecified: Secondary | ICD-10-CM | POA: Diagnosis not present

## 2018-03-07 DIAGNOSIS — I482 Chronic atrial fibrillation, unspecified: Secondary | ICD-10-CM | POA: Diagnosis not present

## 2018-03-07 DIAGNOSIS — E785 Hyperlipidemia, unspecified: Secondary | ICD-10-CM | POA: Diagnosis not present

## 2018-03-12 DIAGNOSIS — J324 Chronic pansinusitis: Secondary | ICD-10-CM | POA: Diagnosis not present

## 2018-04-05 DIAGNOSIS — S0501XA Injury of conjunctiva and corneal abrasion without foreign body, right eye, initial encounter: Secondary | ICD-10-CM | POA: Diagnosis not present

## 2018-05-06 DIAGNOSIS — Z1331 Encounter for screening for depression: Secondary | ICD-10-CM | POA: Diagnosis not present

## 2018-05-06 DIAGNOSIS — M25841 Other specified joint disorders, right hand: Secondary | ICD-10-CM | POA: Diagnosis not present

## 2018-05-06 DIAGNOSIS — Z139 Encounter for screening, unspecified: Secondary | ICD-10-CM | POA: Diagnosis not present

## 2018-05-06 DIAGNOSIS — Z0000 Encounter for general adult medical examination without abnormal findings: Secondary | ICD-10-CM | POA: Diagnosis not present

## 2018-05-13 ENCOUNTER — Encounter: Payer: Self-pay | Admitting: Cardiology

## 2018-05-13 ENCOUNTER — Ambulatory Visit (INDEPENDENT_AMBULATORY_CARE_PROVIDER_SITE_OTHER): Payer: Medicare Other | Admitting: Cardiology

## 2018-05-13 DIAGNOSIS — I4821 Permanent atrial fibrillation: Secondary | ICD-10-CM

## 2018-05-13 DIAGNOSIS — R0609 Other forms of dyspnea: Secondary | ICD-10-CM

## 2018-05-13 DIAGNOSIS — I420 Dilated cardiomyopathy: Secondary | ICD-10-CM

## 2018-05-13 HISTORY — DX: Permanent atrial fibrillation: I48.21

## 2018-05-13 HISTORY — DX: Other forms of dyspnea: R06.09

## 2018-05-13 NOTE — Patient Instructions (Signed)
Medication Instructions:  Your physician recommends that you continue on your current medications as directed. Please refer to the Current Medication list given to you today.  If you need a refill on your cardiac medications before your next appointment, please call your pharmacy.   Lab work: Your physician recommends that you have lab work today: Digoxin level  If you have labs (blood work) drawn today and your tests are completely normal, you will receive your results only by: Marland Kitchen MyChart Message (if you have MyChart) OR . A paper copy in the mail If you have any lab test that is abnormal or we need to change your treatment, we will call you to review the results.  Testing/Procedures: EKG Today  Your physician has requested that you have an echocardiogram. Echocardiography is a painless test that uses sound waves to create images of your heart. It provides your doctor with information about the size and shape of your heart and how well your heart's chambers and valves are working. This procedure takes approximately one hour. There are no restrictions for this procedure.  Your physician has recommended that you wear a holter monitor. Holter monitors are medical devices that record the heart's electrical activity. Doctors most often use these monitors to diagnose arrhythmias. Arrhythmias are problems with the speed or rhythm of the heartbeat. The monitor is a small, portable device. You can wear one while you do your normal daily activities. This is usually used to diagnose what is causing palpitations/syncope (passing out). You will wear your monitor for 48 hours.  Follow-Up: At [facility], you and your health needs are our priority.  As part of our continuing mission to provide you with exceptional heart care, we have created designated Provider Care Teams.  These Care Teams include your primary Cardiologist (physician) and Advanced Practice Providers (APPs -  Physician Assistants and Nurse  Practitioners) who all work together to provide you with the care you need, when you need it. You will need a follow up appointment in 1 months.   You may see  Gypsy Balsam or another member of our [employer] Provider Team in [place]: Norman Herrlich, MD . Belva Crome, MD  Any Other Special Instructions Will Be Listed Below (If Applicable).

## 2018-05-13 NOTE — Addendum Note (Signed)
Addended by: Arville Care on: 05/13/2018 10:59 AM   Modules accepted: Orders

## 2018-05-13 NOTE — Progress Notes (Signed)
Cardiology Office Note:    Date:  05/13/2018   ID:  [name], DOB Feb 04, 1941, MRN [id number]  PCP:  Abner Greenspan, MD  Cardiologist:  Gypsy Balsam, MD    Referring MD: Abner Greenspan, MD   Chief Complaint  Patient presents with  . Follow-up  Doing very well  History of Present Illness:    Becky Robinson is a 77 y.o. female with permanent atrial fibrillation, history of cardiomyopathy with normalization.  We felt cardiomyopathy was related to atrial fibrillation.  Dyspnea on exertion.  She exercised on the regular basis she does have stationary bike and she goes on the bike for half an hour every day on top of that she does have some weights that she exercise with.  Denies having any problem no chest pain tightness squeezing pressure been chest again shortness of breath with exertion.  Past Medical History:  Diagnosis Date  . A-fib (HCC)   . CHF (congestive heart failure) (HCC)   . Hypertension     Past Surgical History:  Procedure Laterality Date  . ABDOMINAL HYSTERECTOMY    . FOOT SURGERY    . TUBAL LIGATION      Current Medications: Current Meds  Medication Sig  . aspirin EC 325 MG tablet Take 325 mg by mouth daily. 10am  . digoxin (LANOXIN) 0.125 MG tablet Take 0.125 mg by mouth daily.   . furosemide (LASIX) 20 MG tablet Take 20 mg by mouth daily as needed for fluid (excessive swelling).   . Methylcobalamin (B12-ACTIVE PO) Take by mouth.  . metoprolol (LOPRESSOR) 50 MG tablet Take 50 mg by mouth daily.   . Multiple Vitamin (MULTIVITAMIN WITH MINERALS) TABS tablet Take 1 tablet by mouth daily.  . Omega-3 Fatty Acids (FISH OIL PO) Take 1 capsule by mouth at bedtime.     Allergies:   Codeine   Social History   Socioeconomic History  . Marital status: Married    Spouse name: Not on file  . Number of children: Not on file  . Years of education: Not on file  . Highest education level: Not on file  Occupational History  . Not on file  Social Needs  .  Financial resource strain: Not on file  . Food insecurity:    Worry: Not on file    Inability: Not on file  . Transportation needs:    Medical: Not on file    Non-medical: Not on file  Tobacco Use  . Smoking status: Never Smoker  . Smokeless tobacco: Never Used  Substance and Sexual Activity  . Alcohol use: No  . Drug use: No  . Sexual activity: Not on file  Lifestyle  . Physical activity:    Days per week: Not on file    Minutes per session: Not on file  . Stress: Not on file  Relationships  . Social connections:    Talks on phone: Not on file    Gets together: Not on file    Attends religious service: Not on file    Active member of club or organization: Not on file    Attends meetings of clubs or organizations: Not on file    Relationship status: Not on file  Other Topics Concern  . Not on file  Social History Narrative  . Not on file     Family History: The patient's family history includes Aneurysm in her brother, mother, and sister; Lung cancer in her father. ROS:   Please see the history of  present illness.    All 14 point review of systems negative except as described per history of present illness  EKGs/Labs/Other Studies Reviewed:    EKG show atrial fibrillation with rapid ventricular rate, incomplete right bundle branch block, nonspecific ST-T segment changes  Recent Labs: No results found for requested labs within last 8760 hours.  Recent Lipid Panel No results found for: CHOL, TRIG, HDL, CHOLHDL, VLDL, LDLCALC, LDLDIRECT  Physical Exam:    VS:  BP 130/80   Pulse (!) 136   Ht 5\' 8"  (1.727 m)   Wt 145 lb (65.8 kg)   SpO2 98%   BMI 22.05 kg/m     Wt Readings from Last 3 Encounters:  05/13/18 145 lb (65.8 kg)     GEN:  Well nourished, well developed in no acute distress HEENT: Normal NECK: No JVD; No carotid bruits LYMPHATICS: No lymphadenopathy CARDIAC: RRR, no murmurs, no rubs, no gallops RESPIRATORY:  Clear to auscultation without rales,  wheezing or rhonchi  ABDOMEN: Soft, non-tender, non-distended MUSCULOSKELETAL:  No edema; No deformity  SKIN: Warm and dry LOWER EXTREMITIES: no swelling NEUROLOGIC:  Alert and oriented x 3 PSYCHIATRIC:  Normal affect   ASSESSMENT:    1. Permanent atrial fibrillation   2. Dilated cardiomyopathy (HCC)   3. Dyspnea on exertion    PLAN:    In order of problems listed above:  1. Permanent atrial fibrillation refused anticoagulation again I really really to rate with her that that put her at risk of having stroke she still does not want to do it she said I been doing well so far I will continue doing what I am doing.  Her rate appears to be fast today on the EKG however she said that she just finished exercises I still think it would be reasonable to put a Holter monitor her for 48 hours to see heart rate variability. 2.  Dilated cardiomyopathy echocardiogram will be repeated. Dyspnea on exertion multifactorial.  Again echocardiogram will be done Holter monitor for 48 hours will be done as well   Medication Adjustments/Labs and Tests Ordered: Current medicines are reviewed at length with the patient today.  Concerns regarding medicines are outlined above.  No orders of the defined types were placed in this encounter.  Medication changes: No orders of the defined types were placed in this encounter.   Signed, Georgeanna Leaobert J. , MD, [facility] 05/13/2018 10:44 AM    [facility]

## 2018-05-14 LAB — DIGOXIN LEVEL: Digoxin, Serum: 0.4 ng/mL — ABNORMAL LOW (ref 0.5–0.9)

## 2018-05-16 ENCOUNTER — Telehealth: Payer: Self-pay | Admitting: Emergency Medicine

## 2018-05-16 NOTE — Telephone Encounter (Signed)
Patient informed of lab results. Patient informed to stop digoxin, she verbally understands.

## 2018-05-17 NOTE — Telephone Encounter (Signed)
Patient calling wanting clarification on why Dr. Bing Matter has stopped her digoxin. Will follow up with him and inform patient.

## 2018-05-17 NOTE — Telephone Encounter (Signed)
Patient informed that Dr. Bing Matter has taken her off digoxin for nausea. She reports she never had any nasuea, only pain in her head however since stopping the medication she feels better now. Patient verbally understands. Dr. Bing Matter aware.

## 2018-05-18 DIAGNOSIS — I77810 Thoracic aortic ectasia: Secondary | ICD-10-CM

## 2018-05-18 DIAGNOSIS — I340 Nonrheumatic mitral (valve) insufficiency: Secondary | ICD-10-CM

## 2018-05-18 HISTORY — DX: Thoracic aortic ectasia: I77.810

## 2018-05-18 HISTORY — DX: Nonrheumatic mitral (valve) insufficiency: I34.0

## 2018-05-27 ENCOUNTER — Ambulatory Visit (INDEPENDENT_AMBULATORY_CARE_PROVIDER_SITE_OTHER): Payer: Medicare Other

## 2018-05-27 DIAGNOSIS — R0609 Other forms of dyspnea: Secondary | ICD-10-CM

## 2018-05-27 DIAGNOSIS — I4819 Other persistent atrial fibrillation: Secondary | ICD-10-CM | POA: Diagnosis not present

## 2018-05-27 DIAGNOSIS — Z6822 Body mass index (BMI) 22.0-22.9, adult: Secondary | ICD-10-CM | POA: Diagnosis not present

## 2018-05-27 DIAGNOSIS — M79605 Pain in left leg: Secondary | ICD-10-CM | POA: Diagnosis not present

## 2018-05-27 DIAGNOSIS — I4821 Permanent atrial fibrillation: Secondary | ICD-10-CM

## 2018-05-27 DIAGNOSIS — M25461 Effusion, right knee: Secondary | ICD-10-CM | POA: Diagnosis not present

## 2018-05-27 DIAGNOSIS — R600 Localized edema: Secondary | ICD-10-CM | POA: Diagnosis not present

## 2018-06-01 DIAGNOSIS — G5603 Carpal tunnel syndrome, bilateral upper limbs: Secondary | ICD-10-CM | POA: Diagnosis not present

## 2018-06-01 DIAGNOSIS — M19031 Primary osteoarthritis, right wrist: Secondary | ICD-10-CM | POA: Diagnosis not present

## 2018-06-01 DIAGNOSIS — I8393 Asymptomatic varicose veins of bilateral lower extremities: Secondary | ICD-10-CM | POA: Diagnosis not present

## 2018-06-01 DIAGNOSIS — M19032 Primary osteoarthritis, left wrist: Secondary | ICD-10-CM | POA: Diagnosis not present

## 2018-06-01 DIAGNOSIS — Z6822 Body mass index (BMI) 22.0-22.9, adult: Secondary | ICD-10-CM | POA: Diagnosis not present

## 2018-06-01 DIAGNOSIS — Z1331 Encounter for screening for depression: Secondary | ICD-10-CM | POA: Diagnosis not present

## 2018-06-01 DIAGNOSIS — M180 Bilateral primary osteoarthritis of first carpometacarpal joints: Secondary | ICD-10-CM | POA: Diagnosis not present

## 2018-06-01 DIAGNOSIS — Z139 Encounter for screening, unspecified: Secondary | ICD-10-CM | POA: Diagnosis not present

## 2018-06-02 ENCOUNTER — Other Ambulatory Visit: Payer: Self-pay

## 2018-06-02 MED ORDER — METOPROLOL TARTRATE 50 MG PO TABS: 75.0000 mg | ORAL_TABLET | Freq: Every day | ORAL | Status: DC

## 2018-06-13 DIAGNOSIS — H35343 Macular cyst, hole, or pseudohole, bilateral: Secondary | ICD-10-CM | POA: Diagnosis not present

## 2018-06-13 DIAGNOSIS — H40013 Open angle with borderline findings, low risk, bilateral: Secondary | ICD-10-CM | POA: Diagnosis not present

## 2018-06-14 DIAGNOSIS — I8311 Varicose veins of right lower extremity with inflammation: Secondary | ICD-10-CM | POA: Diagnosis not present

## 2018-06-14 DIAGNOSIS — I8312 Varicose veins of left lower extremity with inflammation: Secondary | ICD-10-CM | POA: Diagnosis not present

## 2018-06-17 DIAGNOSIS — G5623 Lesion of ulnar nerve, bilateral upper limbs: Secondary | ICD-10-CM

## 2018-06-17 DIAGNOSIS — G5603 Carpal tunnel syndrome, bilateral upper limbs: Secondary | ICD-10-CM

## 2018-06-17 HISTORY — DX: Lesion of ulnar nerve, bilateral upper limbs: G56.23

## 2018-06-17 HISTORY — DX: Carpal tunnel syndrome, bilateral upper limbs: G56.03

## 2018-06-22 ENCOUNTER — Ambulatory Visit (INDEPENDENT_AMBULATORY_CARE_PROVIDER_SITE_OTHER): Payer: Medicare Other

## 2018-06-22 DIAGNOSIS — I4821 Permanent atrial fibrillation: Secondary | ICD-10-CM | POA: Diagnosis not present

## 2018-06-22 DIAGNOSIS — R0609 Other forms of dyspnea: Secondary | ICD-10-CM

## 2018-06-22 DIAGNOSIS — I420 Dilated cardiomyopathy: Secondary | ICD-10-CM | POA: Diagnosis not present

## 2018-06-22 NOTE — Progress Notes (Signed)
Complete echocardiogram has been performed.  Jimmy Tabia Landowski RDCS, RVT 

## 2018-06-23 ENCOUNTER — Ambulatory Visit (INDEPENDENT_AMBULATORY_CARE_PROVIDER_SITE_OTHER): Payer: Medicare Other | Admitting: Cardiology

## 2018-06-23 VITALS — BP 120/68 | HR 98 | Ht 68.0 in | Wt 143.8 lb

## 2018-06-23 DIAGNOSIS — I420 Dilated cardiomyopathy: Secondary | ICD-10-CM

## 2018-06-23 DIAGNOSIS — I4821 Permanent atrial fibrillation: Secondary | ICD-10-CM

## 2018-06-23 DIAGNOSIS — R0609 Other forms of dyspnea: Secondary | ICD-10-CM | POA: Diagnosis not present

## 2018-06-23 NOTE — Patient Instructions (Signed)
Medication Instructions:  Your physician recommends that you continue on your current medications as directed. Please refer to the Current Medication list given to you today.  If you need a refill on your cardiac medications before your next appointment, please call your pharmacy.   Lab work: None.  If you have labs (blood work) drawn today and your tests are completely normal, you will receive your results only by: . MyChart Message (if you have MyChart) OR . A paper copy in the mail If you have any lab test that is abnormal or we need to change your treatment, we will call you to review the results.  Testing/Procedures: None.   Follow-Up: At [facility], you and your health needs are our priority.  As part of our continuing mission to provide you with exceptional heart care, we have created designated Provider Care Teams.  These Care Teams include your primary Cardiologist (physician) and Advanced Practice Providers (APPs -  Physician Assistants and Nurse Practitioners) who all work together to provide you with the care you need, when you need it. You will need a follow up appointment in 3 months.  Please call our office 2 months in advance to schedule this appointment.  You may see No primary care provider on file. or another member of our [facility] Provider Team in [place]: Brian Munley, MD . Rajan Revankar, MD  Any Other Special Instructions Will Be Listed Below (If Applicable).     

## 2018-06-23 NOTE — Progress Notes (Signed)
Cardiology Office Note:    Date:  06/23/2018   ID:  Becky Robinson, DOB Dec 09, 1940, MRN [id number]  PCP:  Abner Greenspan, MD  Cardiologist:  Gypsy Balsam, MD    Referring MD: Abner Greenspan, MD   Chief Complaint  Patient presents with  . 1 month follow up  I am having a lot of leg pain  History of Present Illness:    IYSIS Robinson is a 78 y.o. female with permanent atrial fibrillation there was some issue with controlling her rate monitor was done showed accelerated rate dose of metoprolol has been increased to 1-1/2 tablets which is 75 mg daily again we had discussion about anticoagulation she does not want to do it because complaint she has today is chronic pain in her left leg pain all the time she wakes up with this she goes to sleep with this he she has difficulty sleeping because of pain.  She went to a vein specialist who gave her some medication that seems to be may be helping somewhat.  There is some additional work that need to be done about the leg.  She did have ultrasounds done by primary care physician office that is negative for DVT.  Past Medical History:  Diagnosis Date  . A-fib (HCC)   . CHF (congestive heart failure) (HCC)   . Hypertension     Past Surgical History:  Procedure Laterality Date  . ABDOMINAL HYSTERECTOMY    . FOOT SURGERY    . TUBAL LIGATION      Current Medications: Current Meds  Medication Sig  . aspirin EC 325 MG tablet Take 325 mg by mouth daily. 10am  . furosemide (LASIX) 20 MG tablet Take 20 mg by mouth daily as needed for fluid (excessive swelling).   . Methylcobalamin (B12-ACTIVE PO) Take by mouth.  . metoprolol tartrate (LOPRESSOR) 50 MG tablet Take 1.5 tablets (75 mg total) by mouth daily.  . Multiple Vitamin (MULTIVITAMIN WITH MINERALS) TABS tablet Take 1 tablet by mouth daily.  . Omega-3 Fatty Acids (FISH OIL PO) Take 1 capsule by mouth at bedtime.  Marland Kitchen UNABLE TO FIND Take 1 tablet by mouth daily. Med Name: V     Allergies:    Codeine   Social History   Socioeconomic History  . Marital status: Married    Spouse name: Not on file  . Number of children: Not on file  . Years of education: Not on file  . Highest education level: Not on file  Occupational History  . Not on file  Social Needs  . Financial resource strain: Not on file  . Food insecurity:    Worry: Not on file    Inability: Not on file  . Transportation needs:    Medical: Not on file    Non-medical: Not on file  Tobacco Use  . Smoking status: Never Smoker  . Smokeless tobacco: Never Used  Substance and Sexual Activity  . Alcohol use: No  . Drug use: No  . Sexual activity: Not on file  Lifestyle  . Physical activity:    Days per week: Not on file    Minutes per session: Not on file  . Stress: Not on file  Relationships  . Social connections:    Talks on phone: Not on file    Gets together: Not on file    Attends religious service: Not on file    Active member of club or organization: Not on file    Attends meetings of  clubs or organizations: Not on file    Relationship status: Not on file  Other Topics Concern  . Not on file  Social History Narrative  . Not on file     Family History: The patient's family history includes Aneurysm in her brother, mother, and sister; Lung cancer in her father. ROS:   Please see the history of present illness.    All 14 point review of systems negative except as described per history of present illness  EKGs/Labs/Other Studies Reviewed:      Recent Labs: No results found for requested labs within last 8760 hours.  Recent Lipid Panel No results found for: CHOL, TRIG, HDL, CHOLHDL, VLDL, LDLCALC, LDLDIRECT  Physical Exam:    VS:  BP 120/68   Pulse 98   Ht 5\' 8"  (1.727 m)   Wt 143 lb 12.8 oz (65.2 kg)   SpO2 96%   BMI 21.86 kg/m     Wt Readings from Last 3 Encounters:  06/23/18 143 lb 12.8 oz (65.2 kg)  05/13/18 145 lb (65.8 kg)     GEN:  Well nourished, well developed in no  acute distress HEENT: Normal NECK: No JVD; No carotid bruits LYMPHATICS: No lymphadenopathy CARDIAC: Irregularly irregular, no murmurs, no rubs, no gallops RESPIRATORY:  Clear to auscultation without rales, wheezing or rhonchi  ABDOMEN: Soft, non-tender, non-distended MUSCULOSKELETAL:  No edema; No deformity  SKIN: Warm and dry LOWER EXTREMITIES: no swelling NEUROLOGIC:  Alert and oriented x 3 PSYCHIATRIC:  Normal affect   ASSESSMENT:    1. Permanent atrial fibrillation   2. Dilated cardiomyopathy (HCC)   3. Dyspnea on exertion    PLAN:    In order of problems listed above:  1. Permanent atrial fibrillation.  Rate appears to be better controlled right now we will continue present medication again refused anticoagulation. 2. History of dilated cardiomyopathy however ejection fraction of 50%. 3. Dyspnea on exertion doing well from that point review.   Medication Adjustments/Labs and Tests Ordered: Current medicines are reviewed at length with the patient today.  Concerns regarding medicines are outlined above.  No orders of the defined types were placed in this encounter.  Medication changes: No orders of the defined types were placed in this encounter.   Signed, Georgeanna Lea, MD, [facility] 06/23/2018 10:54 AM    [facility]

## 2018-06-27 DIAGNOSIS — M67431 Ganglion, right wrist: Secondary | ICD-10-CM | POA: Diagnosis not present

## 2018-06-27 DIAGNOSIS — G5622 Lesion of ulnar nerve, left upper limb: Secondary | ICD-10-CM | POA: Diagnosis not present

## 2018-06-27 DIAGNOSIS — G5603 Carpal tunnel syndrome, bilateral upper limbs: Secondary | ICD-10-CM | POA: Diagnosis not present

## 2018-06-27 DIAGNOSIS — G5621 Lesion of ulnar nerve, right upper limb: Secondary | ICD-10-CM | POA: Diagnosis not present

## 2018-06-27 DIAGNOSIS — M19031 Primary osteoarthritis, right wrist: Secondary | ICD-10-CM | POA: Diagnosis not present

## 2018-06-28 ENCOUNTER — Telehealth: Payer: Self-pay | Admitting: Cardiology

## 2018-06-28 NOTE — Telephone Encounter (Signed)
Called for results of echocardiogram

## 2018-06-28 NOTE — Telephone Encounter (Signed)
Patient informed of results.  

## 2018-06-30 DIAGNOSIS — I8311 Varicose veins of right lower extremity with inflammation: Secondary | ICD-10-CM | POA: Diagnosis not present

## 2018-06-30 DIAGNOSIS — I8312 Varicose veins of left lower extremity with inflammation: Secondary | ICD-10-CM | POA: Diagnosis not present

## 2018-07-12 DIAGNOSIS — N3000 Acute cystitis without hematuria: Secondary | ICD-10-CM | POA: Diagnosis not present

## 2018-07-12 DIAGNOSIS — N3090 Cystitis, unspecified without hematuria: Secondary | ICD-10-CM | POA: Diagnosis not present

## 2018-07-20 DIAGNOSIS — I8312 Varicose veins of left lower extremity with inflammation: Secondary | ICD-10-CM | POA: Diagnosis not present

## 2018-07-20 DIAGNOSIS — I8311 Varicose veins of right lower extremity with inflammation: Secondary | ICD-10-CM | POA: Diagnosis not present

## 2018-08-16 DIAGNOSIS — E785 Hyperlipidemia, unspecified: Secondary | ICD-10-CM | POA: Diagnosis not present

## 2018-08-16 DIAGNOSIS — I482 Chronic atrial fibrillation, unspecified: Secondary | ICD-10-CM | POA: Diagnosis not present

## 2018-08-16 DIAGNOSIS — I8393 Asymptomatic varicose veins of bilateral lower extremities: Secondary | ICD-10-CM | POA: Diagnosis not present

## 2018-08-16 DIAGNOSIS — R7303 Prediabetes: Secondary | ICD-10-CM | POA: Diagnosis not present

## 2018-08-16 DIAGNOSIS — I429 Cardiomyopathy, unspecified: Secondary | ICD-10-CM | POA: Diagnosis not present

## 2018-08-22 ENCOUNTER — Other Ambulatory Visit: Payer: Self-pay

## 2018-08-22 MED ORDER — FUROSEMIDE 20 MG PO TABS: 20.0000 mg | ORAL_TABLET | Freq: Every day | ORAL | 1 refills | Status: DC | PRN

## 2018-09-15 DIAGNOSIS — I429 Cardiomyopathy, unspecified: Secondary | ICD-10-CM | POA: Diagnosis not present

## 2018-09-15 DIAGNOSIS — E785 Hyperlipidemia, unspecified: Secondary | ICD-10-CM | POA: Diagnosis not present

## 2018-09-15 DIAGNOSIS — R7303 Prediabetes: Secondary | ICD-10-CM | POA: Diagnosis not present

## 2018-09-15 DIAGNOSIS — I482 Chronic atrial fibrillation, unspecified: Secondary | ICD-10-CM | POA: Diagnosis not present

## 2018-09-30 DIAGNOSIS — I8312 Varicose veins of left lower extremity with inflammation: Secondary | ICD-10-CM | POA: Diagnosis not present

## 2018-10-11 DIAGNOSIS — I8311 Varicose veins of right lower extremity with inflammation: Secondary | ICD-10-CM | POA: Diagnosis not present

## 2018-10-11 DIAGNOSIS — M7981 Nontraumatic hematoma of soft tissue: Secondary | ICD-10-CM | POA: Diagnosis not present

## 2018-10-14 DIAGNOSIS — I429 Cardiomyopathy, unspecified: Secondary | ICD-10-CM | POA: Diagnosis not present

## 2018-10-14 DIAGNOSIS — R7303 Prediabetes: Secondary | ICD-10-CM | POA: Diagnosis not present

## 2018-10-14 DIAGNOSIS — E785 Hyperlipidemia, unspecified: Secondary | ICD-10-CM | POA: Diagnosis not present

## 2018-10-14 DIAGNOSIS — I482 Chronic atrial fibrillation, unspecified: Secondary | ICD-10-CM | POA: Diagnosis not present

## 2018-10-25 DIAGNOSIS — I8312 Varicose veins of left lower extremity with inflammation: Secondary | ICD-10-CM | POA: Diagnosis not present

## 2018-11-08 DIAGNOSIS — M7981 Nontraumatic hematoma of soft tissue: Secondary | ICD-10-CM | POA: Diagnosis not present

## 2018-11-08 DIAGNOSIS — I8312 Varicose veins of left lower extremity with inflammation: Secondary | ICD-10-CM | POA: Diagnosis not present

## 2018-11-15 DIAGNOSIS — E785 Hyperlipidemia, unspecified: Secondary | ICD-10-CM | POA: Diagnosis not present

## 2018-11-15 DIAGNOSIS — R7303 Prediabetes: Secondary | ICD-10-CM | POA: Diagnosis not present

## 2018-11-15 DIAGNOSIS — I429 Cardiomyopathy, unspecified: Secondary | ICD-10-CM | POA: Diagnosis not present

## 2018-11-15 DIAGNOSIS — I482 Chronic atrial fibrillation, unspecified: Secondary | ICD-10-CM | POA: Diagnosis not present

## 2018-11-29 DIAGNOSIS — M7981 Nontraumatic hematoma of soft tissue: Secondary | ICD-10-CM | POA: Diagnosis not present

## 2018-11-29 DIAGNOSIS — I8311 Varicose veins of right lower extremity with inflammation: Secondary | ICD-10-CM | POA: Diagnosis not present

## 2018-12-02 DIAGNOSIS — H40013 Open angle with borderline findings, low risk, bilateral: Secondary | ICD-10-CM | POA: Diagnosis not present

## 2018-12-16 DIAGNOSIS — M7981 Nontraumatic hematoma of soft tissue: Secondary | ICD-10-CM | POA: Diagnosis not present

## 2018-12-16 DIAGNOSIS — I8312 Varicose veins of left lower extremity with inflammation: Secondary | ICD-10-CM | POA: Diagnosis not present

## 2019-01-03 DIAGNOSIS — I8311 Varicose veins of right lower extremity with inflammation: Secondary | ICD-10-CM | POA: Diagnosis not present

## 2019-01-16 DIAGNOSIS — I482 Chronic atrial fibrillation, unspecified: Secondary | ICD-10-CM | POA: Diagnosis not present

## 2019-01-16 DIAGNOSIS — R7303 Prediabetes: Secondary | ICD-10-CM | POA: Diagnosis not present

## 2019-01-16 DIAGNOSIS — E785 Hyperlipidemia, unspecified: Secondary | ICD-10-CM | POA: Diagnosis not present

## 2019-01-16 DIAGNOSIS — I429 Cardiomyopathy, unspecified: Secondary | ICD-10-CM | POA: Diagnosis not present

## 2019-01-17 DIAGNOSIS — M7981 Nontraumatic hematoma of soft tissue: Secondary | ICD-10-CM | POA: Diagnosis not present

## 2019-01-17 DIAGNOSIS — I8312 Varicose veins of left lower extremity with inflammation: Secondary | ICD-10-CM | POA: Diagnosis not present

## 2019-01-28 DIAGNOSIS — N3001 Acute cystitis with hematuria: Secondary | ICD-10-CM | POA: Diagnosis not present

## 2019-01-28 DIAGNOSIS — N3091 Cystitis, unspecified with hematuria: Secondary | ICD-10-CM | POA: Diagnosis not present

## 2019-02-15 DIAGNOSIS — I429 Cardiomyopathy, unspecified: Secondary | ICD-10-CM | POA: Diagnosis not present

## 2019-02-15 DIAGNOSIS — E785 Hyperlipidemia, unspecified: Secondary | ICD-10-CM | POA: Diagnosis not present

## 2019-02-15 DIAGNOSIS — I482 Chronic atrial fibrillation, unspecified: Secondary | ICD-10-CM | POA: Diagnosis not present

## 2019-02-15 DIAGNOSIS — R7303 Prediabetes: Secondary | ICD-10-CM | POA: Diagnosis not present

## 2019-02-18 ENCOUNTER — Other Ambulatory Visit: Payer: Self-pay | Admitting: Cardiology

## 2019-03-17 DIAGNOSIS — I429 Cardiomyopathy, unspecified: Secondary | ICD-10-CM | POA: Diagnosis not present

## 2019-03-17 DIAGNOSIS — I482 Chronic atrial fibrillation, unspecified: Secondary | ICD-10-CM | POA: Diagnosis not present

## 2019-03-17 DIAGNOSIS — E785 Hyperlipidemia, unspecified: Secondary | ICD-10-CM | POA: Diagnosis not present

## 2019-03-17 DIAGNOSIS — R7303 Prediabetes: Secondary | ICD-10-CM | POA: Diagnosis not present

## 2019-03-26 DIAGNOSIS — J0100 Acute maxillary sinusitis, unspecified: Secondary | ICD-10-CM | POA: Diagnosis not present

## 2019-04-07 DIAGNOSIS — I8312 Varicose veins of left lower extremity with inflammation: Secondary | ICD-10-CM | POA: Diagnosis not present

## 2019-04-17 DIAGNOSIS — I429 Cardiomyopathy, unspecified: Secondary | ICD-10-CM | POA: Diagnosis not present

## 2019-04-17 DIAGNOSIS — I482 Chronic atrial fibrillation, unspecified: Secondary | ICD-10-CM | POA: Diagnosis not present

## 2019-04-17 DIAGNOSIS — E785 Hyperlipidemia, unspecified: Secondary | ICD-10-CM | POA: Diagnosis not present

## 2019-04-17 DIAGNOSIS — R7303 Prediabetes: Secondary | ICD-10-CM | POA: Diagnosis not present

## 2019-05-24 ENCOUNTER — Other Ambulatory Visit: Payer: Self-pay | Admitting: Cardiology

## 2019-06-06 ENCOUNTER — Encounter: Payer: Self-pay | Admitting: Cardiology

## 2019-06-06 ENCOUNTER — Other Ambulatory Visit: Payer: Self-pay

## 2019-06-06 ENCOUNTER — Ambulatory Visit (INDEPENDENT_AMBULATORY_CARE_PROVIDER_SITE_OTHER): Payer: Medicare Other | Admitting: Cardiology

## 2019-06-06 ENCOUNTER — Telehealth: Payer: Self-pay | Admitting: Cardiology

## 2019-06-06 VITALS — BP 134/90 | HR 98 | Ht 68.0 in | Wt 146.0 lb

## 2019-06-06 DIAGNOSIS — R7301 Impaired fasting glucose: Secondary | ICD-10-CM | POA: Insufficient documentation

## 2019-06-06 DIAGNOSIS — Z0000 Encounter for general adult medical examination without abnormal findings: Secondary | ICD-10-CM | POA: Diagnosis not present

## 2019-06-06 DIAGNOSIS — I340 Nonrheumatic mitral (valve) insufficiency: Secondary | ICD-10-CM

## 2019-06-06 DIAGNOSIS — I517 Cardiomegaly: Secondary | ICD-10-CM | POA: Diagnosis not present

## 2019-06-06 DIAGNOSIS — Z7689 Persons encountering health services in other specified circumstances: Secondary | ICD-10-CM | POA: Diagnosis not present

## 2019-06-06 DIAGNOSIS — I4821 Permanent atrial fibrillation: Secondary | ICD-10-CM

## 2019-06-06 DIAGNOSIS — M25462 Effusion, left knee: Secondary | ICD-10-CM | POA: Diagnosis not present

## 2019-06-06 DIAGNOSIS — Z23 Encounter for immunization: Secondary | ICD-10-CM | POA: Diagnosis not present

## 2019-06-06 DIAGNOSIS — I480 Paroxysmal atrial fibrillation: Secondary | ICD-10-CM | POA: Insufficient documentation

## 2019-06-06 DIAGNOSIS — I420 Dilated cardiomyopathy: Secondary | ICD-10-CM | POA: Diagnosis not present

## 2019-06-06 DIAGNOSIS — R0989 Other specified symptoms and signs involving the circulatory and respiratory systems: Secondary | ICD-10-CM | POA: Insufficient documentation

## 2019-06-06 DIAGNOSIS — M25562 Pain in left knee: Secondary | ICD-10-CM | POA: Diagnosis not present

## 2019-06-06 DIAGNOSIS — I447 Left bundle-branch block, unspecified: Secondary | ICD-10-CM | POA: Insufficient documentation

## 2019-06-06 DIAGNOSIS — R8281 Pyuria: Secondary | ICD-10-CM | POA: Diagnosis not present

## 2019-06-06 DIAGNOSIS — E785 Hyperlipidemia, unspecified: Secondary | ICD-10-CM

## 2019-06-06 DIAGNOSIS — I4891 Unspecified atrial fibrillation: Secondary | ICD-10-CM | POA: Diagnosis not present

## 2019-06-06 DIAGNOSIS — S83002A Unspecified subluxation of left patella, initial encounter: Secondary | ICD-10-CM | POA: Diagnosis not present

## 2019-06-06 DIAGNOSIS — M1712 Unilateral primary osteoarthritis, left knee: Secondary | ICD-10-CM | POA: Diagnosis not present

## 2019-06-06 HISTORY — DX: Paroxysmal atrial fibrillation: I48.0

## 2019-06-06 HISTORY — DX: Nonrheumatic mitral (valve) insufficiency: I34.0

## 2019-06-06 HISTORY — DX: Left bundle-branch block, unspecified: I44.7

## 2019-06-06 HISTORY — DX: Other specified symptoms and signs involving the circulatory and respiratory systems: R09.89

## 2019-06-06 HISTORY — DX: Hyperlipidemia, unspecified: E78.5

## 2019-06-06 HISTORY — DX: Impaired fasting glucose: R73.01

## 2019-06-06 HISTORY — DX: Pain in left knee: M25.562

## 2019-06-06 MED ORDER — METOPROLOL TARTRATE 50 MG PO TABS: 75.0000 mg | ORAL_TABLET | Freq: Every day | ORAL | 1 refills | Status: DC

## 2019-06-06 NOTE — Patient Instructions (Signed)
Medication Instructions:  Your physician recommends that you continue on your current medications as directed. Please refer to the Current Medication list given to you today.  *If you need a refill on your cardiac medications before your next appointment, please call your pharmacy*  Lab Work: nOne If you have labs (blood work) drawn today and your tests are completely normal, you will receive your results only by: Marland Kitchen MyChart Message (if you have MyChart) OR . A paper copy in the mail If you have any lab test that is abnormal or we need to change your treatment, we will call you to review the results.  Testing/Procedures: nOne  Follow-Up: At [facility], you and your health needs are our priority.  As part of our continuing mission to provide you with exceptional heart care, we have created designated Provider Care Teams.  These Care Teams include your primary Cardiologist (physician) and Advanced Practice Providers (APPs -  Physician Assistants and Nurse Practitioners) who all work together to provide you with the care you need, when you need it.  Your next appointment:   6 month(s)  The format for your next appointment:   In Person  Provider:   Gypsy Balsam, MD  Other Instructions

## 2019-06-06 NOTE — Progress Notes (Signed)
Cardiology Office Note:    Date:  06/06/2019   ID:  Becky Robinson, DOB 16-Jan-1941, MRN [id number]  PCP:  Hadley Pen, MD  Cardiologist:  Thomasene Ripple, DO  Electrophysiologist:  None   Referring MD: Abner Greenspan, MD   Follow-up visit  History of Present Illness:    LADA Becky Robinson is a 79 y.o. female with a hx of permanent atrial fibrillation the patient is on 35 mg of metoprolol tartrate daily, but notes she does not want to get on anticoagulation she has been on aspirin 325 mg daily.  She did see Dr. Marybelle Killings on June 23, 2018 at which time she was continued on her rate control agents and again coagulation was discussed patient refused.  She was referred to be seen by her PCP today because she was in atrial fibrillation.  She denies any chest pain, shortness of breath, nausea, vomiting.  She states that she has limited her exercise due to her knee pain but otherwise is doing well.  Past Medical History:  Diagnosis Date  . A-fib (HCC)   . CHF (congestive heart failure) (HCC)   . Hypertension     Past Surgical History:  Procedure Laterality Date  . ABDOMINAL HYSTERECTOMY    . FOOT SURGERY    . TUBAL LIGATION      Current Medications: Current Meds  Medication Sig  . aspirin EC 325 MG tablet Take 325 mg by mouth daily. 10am  . furosemide (LASIX) 20 MG tablet TAKE 1 TABLET BY MOUTH DAILY AS NEEDED FOR FLUID (EXCESSIVE SWELLING).  . Methylcobalamin (B12-ACTIVE PO) Take by mouth.  . metoprolol tartrate (LOPRESSOR) 50 MG tablet Take 1.5 tablets (75 mg total) by mouth daily.  . Multiple Vitamin (MULTIVITAMIN WITH MINERALS) TABS tablet Take 1 tablet by mouth daily.  . Omega-3 Fatty Acids (FISH OIL PO) Take 1 capsule by mouth at bedtime.  Marland Kitchen UNABLE TO FIND Take 1 tablet by mouth daily. Med Name: V     Allergies:   Codeine   Social History   Socioeconomic History  . Marital status: Married    Spouse name: Not on file  . Number of children: Not on file  . Years of  education: Not on file  . Highest education level: Not on file  Occupational History  . Not on file  Tobacco Use  . Smoking status: Never Smoker  . Smokeless tobacco: Never Used  Substance and Sexual Activity  . Alcohol use: No  . Drug use: No  . Sexual activity: Not on file  Other Topics Concern  . Not on file  Social History Narrative  . Not on file   Social Determinants of Health   Financial Resource Strain:   . Difficulty of Paying Living Expenses: Not on file  Food Insecurity:   . Worried About Programme researcher, broadcasting/film/video in the Last Year: Not on file  . Ran Out of Food in the Last Year: Not on file  Transportation Needs:   . Lack of Transportation (Medical): Not on file  . Lack of Transportation (Non-Medical): Not on file  Physical Activity:   . Days of Exercise per Week: Not on file  . Minutes of Exercise per Session: Not on file  Stress:   . Feeling of Stress : Not on file  Social Connections:   . Frequency of Communication with Friends and Family: Not on file  . Frequency of Social Gatherings with Friends and Family: Not on file  . Attends Religious  Services: Not on file  . Active Member of Clubs or Organizations: Not on file  . Attends Archivist Meetings: Not on file  . Marital Status: Not on file     Family History: The patient's family history includes Aneurysm in her brother, mother, and sister; Lung cancer in her father.  ROS:   Review of Systems  Constitution: Negative for decreased appetite, fever and weight gain.  HENT: Negative for congestion, ear discharge, hoarse voice and sore throat.   Eyes: Negative for discharge, redness, vision loss in right eye and visual halos.  Cardiovascular: Negative for chest pain, dyspnea on exertion, leg swelling, orthopnea and palpitations.  Respiratory: Negative for cough, hemoptysis, shortness of breath and snoring.   Endocrine: Negative for heat intolerance and polyphagia.  Hematologic/Lymphatic: Negative for  bleeding problem. Does not bruise/bleed easily.  Skin: Negative for flushing, nail changes, rash and suspicious lesions.  Musculoskeletal: Negative for arthritis, joint pain, muscle cramps, myalgias, neck pain and stiffness.  Gastrointestinal: Negative for abdominal pain, bowel incontinence, diarrhea and excessive appetite.  Genitourinary: Negative for decreased libido, genital sores and incomplete emptying.  Neurological: Negative for brief paralysis, focal weakness, headaches and loss of balance.  Psychiatric/Behavioral: Negative for altered mental status, depression and suicidal ideas.  Allergic/Immunologic: Negative for HIV exposure and persistent infections.    EKGs/Labs/Other Studies Reviewed:    The following studies were reviewed today:   EKG:  The ekg ordered today demonstrates atrial fibrillation, heart rate 98 bpm.  Prior EKG done on 05/13/2018 patient is no longer a rapid ventricular rate.  Transthoracic echocardiogram done 06/22/2018 IMPRESSIONS   1. The left ventricle has mildly reduced systolic function of 61-60%. The cavity size is normal. There is moderately increased left ventricular wall thickness. Left ventricular diastology could not be evaluated secondary to atrial fibrillation.  2. The right ventricle has normal systolic function. The cavity in normal in size. There is no increase in right ventricular wall thickness.  3. Severely dilated left atrial size.  4. The mitral valve is degenerative There is mild thickening. Mitral valve regurgitation is mild to moderate by color flow Doppler.  5. The aortic valve is tricuspid. There is mild thickening of the aortic valve.  6. There is mild dilatation of the ascending aorta.  7. The inferior vena cava was dilated in size with >50% respiratory variability.  8. No evidence of left ventricular regional wall motion abnormalities.   Recent Labs: No results found for requested labs within last 8760 hours.  Recent Lipid  Panel No results found for: CHOL, TRIG, HDL, CHOLHDL, VLDL, LDLCALC, LDLDIRECT  Physical Exam:    VS:  BP 134/90   Pulse 98   Ht 5\' 8"  (1.727 m)   Wt 146 lb (66.2 kg)   SpO2 98%   BMI 22.20 kg/m     Wt Readings from Last 3 Encounters:  06/06/19 146 lb (66.2 kg)  06/23/18 143 lb 12.8 oz (65.2 kg)  05/13/18 145 lb (65.8 kg)     GEN: Well nourished, well developed in no acute distress HEENT: Normal NECK: No JVD; No carotid bruits LYMPHATICS: No lymphadenopathy CARDIAC: S1S2 noted,RRR, no murmurs, rubs, gallops RESPIRATORY:  Clear to auscultation without rales, wheezing or rhonchi  ABDOMEN: Soft, non-tender, non-distended, +bowel sounds, no guarding. EXTREMITIES: No edema, No cyanosis, no clubbing MUSCULOSKELETAL:  No edema; No deformity  SKIN: Warm and dry NEUROLOGIC:  Alert and oriented x 3, non-focal PSYCHIATRIC:  Normal affect, good insight  ASSESSMENT:    1.  Permanent atrial fibrillation (HCC)   2. Nonrheumatic mitral valve regurgitation   3. Dilated cardiomyopathy (HCC)   4. Depressed left ventricular ejection fraction    PLAN:    The patient is seen permanent atrial fibrillation and is being treated with rate control strategy.  Today she does have a controlled ventricular rate of 90 bpm.  Is asymptomatic in atrial fibrillation.  I did speak with her today again about stability of anticoagulation with a DOAC but she has declined and prefers to stay on aspirin 325 mg daily.  She has no plans for knee surgery for now.  But I did educate her that she may need to undergo stress test in the event that she is planning any surgery.  The patient is in agreement with the above plan. The patient left the office in stable condition.  The patient will follow up in 6 months with Dr. Bing Matter   Medication Adjustments/Labs and Tests Ordered: Current medicines are reviewed at length with the patient today.  Concerns regarding medicines are outlined above.  No orders of the  defined types were placed in this encounter.  No orders of the defined types were placed in this encounter.   Patient Instructions  Medication Instructions:  Your physician recommends that you continue on your current medications as directed. Please refer to the Current Medication list given to you today.  *If you need a refill on your cardiac medications before your next appointment, please call your pharmacy*  Lab Work: nOne If you have labs (blood work) drawn today and your tests are completely normal, you will receive your results only by: Marland Kitchen MyChart Message (if you have MyChart) OR . A paper copy in the mail If you have any lab test that is abnormal or we need to change your treatment, we will call you to review the results.  Testing/Procedures: nOne  Follow-Up: At [facility], you and your health needs are our priority.  As part of our continuing mission to provide you with exceptional heart care, we have created designated Provider Care Teams.  These Care Teams include your primary Cardiologist (physician) and Advanced Practice Providers (APPs -  Physician Assistants and Nurse Practitioners) who all work together to provide you with the care you need, when you need it.  Your next appointment:   6 month(s)  The format for your next appointment:   In Person  Provider:   Gypsy Balsam, MD  Other Instructions      Adopting a Healthy Lifestyle.  Know what a healthy weight is for you (roughly BMI <25) and aim to maintain this   Aim for 7+ servings of fruits and vegetables daily   65-80+ fluid ounces of water or unsweet tea for healthy kidneys   Limit to max 1 drink of alcohol per day; avoid smoking/tobacco   Limit animal fats in diet for cholesterol and heart health - choose grass fed whenever available   Avoid highly processed foods, and foods high in saturated/trans fats   Aim for low stress - take time to unwind and care for your mental health   Aim for  150 min of moderate intensity exercise weekly for heart health, and weights twice weekly for bone health   Aim for 7-9 hours of sleep daily   When it comes to diets, agreement about the perfect plan isnt easy to find, even among the experts. Experts at the [facility] [employer] developed an idea known as the Healthy Eating Plate. Just  imagine a plate divided into logical, healthy portions.   The emphasis is on diet quality:   Load up on vegetables and fruits - one-half of your plate: Aim for color and variety, and remember that potatoes dont count.   Go for whole grains - one-quarter of your plate: Whole wheat, barley, wheat berries, quinoa, oats, brown rice, and foods made with them. If you want pasta, go with whole wheat pasta.   Protein power - one-quarter of your plate: Fish, chicken, beans, and nuts are all healthy, versatile protein sources. Limit red meat.   The diet, however, does go beyond the plate, offering a few other suggestions.   Use healthy plant oils, such as olive, canola, soy, corn, sunflower and peanut. Check the labels, and avoid partially hydrogenated oil, which have unhealthy trans fats.   If youre thirsty, drink water. Coffee and tea are good in moderation, but skip sugary drinks and limit milk and dairy products to one or two daily servings.   The type of carbohydrate in the diet is more important than the amount. Some sources of carbohydrates, such as vegetables, fruits, whole grains, and beans-are healthier than others.   Finally, stay active  Signed, Thomasene Ripple, DO  06/06/2019 2:04 PM    [facility]

## 2019-06-06 NOTE — Telephone Encounter (Signed)
New Message:  Lind Guest, NP at [facility] saw the patient today as a first visit to establish care with the NP who is supervised by Dr. Sherral Hammers PCP. The pt has no pain or complaints at the time of visit  The patient states she has not taken medication in about a year. The pt has a family hx of aneurisms and does not want to take medication. The patient also has a hx of permanent afib as well as cardiomyopathy and CHF.   The pt had an ekg done and the ekg shows that the pt is in afib, has LBBB and nonspecific ST and T wave abnormalities.   The NP was able to see past records in Epic, but she wants to follow up with Dr. Kirtland Bouchard to get more advice as to how to proceed with this patient

## 2019-06-06 NOTE — Telephone Encounter (Signed)
Patient put on as same day add on to Dr Mallory Shirk schedule

## 2019-06-09 DIAGNOSIS — L309 Dermatitis, unspecified: Secondary | ICD-10-CM

## 2019-06-09 HISTORY — DX: Dermatitis, unspecified: L30.9

## 2019-06-14 DIAGNOSIS — M1712 Unilateral primary osteoarthritis, left knee: Secondary | ICD-10-CM | POA: Diagnosis not present

## 2019-06-27 ENCOUNTER — Other Ambulatory Visit: Payer: Self-pay | Admitting: Cardiology

## 2019-07-25 ENCOUNTER — Other Ambulatory Visit: Payer: Self-pay | Admitting: Cardiology

## 2019-09-13 DIAGNOSIS — M1712 Unilateral primary osteoarthritis, left knee: Secondary | ICD-10-CM | POA: Diagnosis not present

## 2019-09-22 ENCOUNTER — Other Ambulatory Visit: Payer: Self-pay | Admitting: Cardiology

## 2020-01-04 DIAGNOSIS — Z23 Encounter for immunization: Secondary | ICD-10-CM | POA: Diagnosis not present

## 2020-01-25 DIAGNOSIS — Z23 Encounter for immunization: Secondary | ICD-10-CM | POA: Diagnosis not present

## 2020-02-02 DIAGNOSIS — H35343 Macular cyst, hole, or pseudohole, bilateral: Secondary | ICD-10-CM | POA: Diagnosis not present

## 2020-02-02 DIAGNOSIS — H40013 Open angle with borderline findings, low risk, bilateral: Secondary | ICD-10-CM | POA: Diagnosis not present

## 2020-02-02 DIAGNOSIS — Z961 Presence of intraocular lens: Secondary | ICD-10-CM | POA: Diagnosis not present

## 2020-02-08 DIAGNOSIS — H35071 Retinal telangiectasis, right eye: Secondary | ICD-10-CM | POA: Diagnosis not present

## 2020-02-08 DIAGNOSIS — H35079 Retinal telangiectasis, unspecified eye: Secondary | ICD-10-CM | POA: Diagnosis not present

## 2020-02-19 DIAGNOSIS — S60465A Insect bite (nonvenomous) of left ring finger, initial encounter: Secondary | ICD-10-CM | POA: Diagnosis not present

## 2020-07-01 ENCOUNTER — Other Ambulatory Visit: Payer: Self-pay | Admitting: Cardiology

## 2020-07-01 NOTE — Telephone Encounter (Signed)
Denied refill request, last seen in 06/2018 with Dr. Bing Matter

## 2020-09-27 ENCOUNTER — Other Ambulatory Visit: Payer: Self-pay

## 2020-09-27 DIAGNOSIS — I10 Essential (primary) hypertension: Secondary | ICD-10-CM | POA: Insufficient documentation

## 2020-09-27 DIAGNOSIS — I509 Heart failure, unspecified: Secondary | ICD-10-CM | POA: Insufficient documentation

## 2020-10-02 ENCOUNTER — Ambulatory Visit: Payer: Medicare Other | Admitting: Cardiology

## 2020-11-21 ENCOUNTER — Encounter: Payer: Self-pay | Admitting: Cardiology

## 2020-11-21 ENCOUNTER — Other Ambulatory Visit: Payer: Self-pay

## 2020-11-21 ENCOUNTER — Ambulatory Visit (INDEPENDENT_AMBULATORY_CARE_PROVIDER_SITE_OTHER): Payer: Medicare Other | Admitting: Cardiology

## 2020-11-21 ENCOUNTER — Telehealth: Payer: Self-pay | Admitting: Cardiology

## 2020-11-21 VITALS — BP 102/66 | HR 124 | Ht 68.0 in | Wt 143.2 lb

## 2020-11-21 DIAGNOSIS — I4821 Permanent atrial fibrillation: Secondary | ICD-10-CM | POA: Diagnosis not present

## 2020-11-21 DIAGNOSIS — I10 Essential (primary) hypertension: Secondary | ICD-10-CM | POA: Diagnosis not present

## 2020-11-21 DIAGNOSIS — Z9114 Patient's other noncompliance with medication regimen: Secondary | ICD-10-CM | POA: Insufficient documentation

## 2020-11-21 DIAGNOSIS — E782 Mixed hyperlipidemia: Secondary | ICD-10-CM

## 2020-11-21 DIAGNOSIS — Z91148 Patient's other noncompliance with medication regimen for other reason: Secondary | ICD-10-CM

## 2020-11-21 HISTORY — DX: Patient's other noncompliance with medication regimen for other reason: Z91.148

## 2020-11-21 MED ORDER — METOPROLOL SUCCINATE ER 50 MG PO TB24: 50.0000 mg | ORAL_TABLET | Freq: Every day | ORAL | 3 refills | Status: DC

## 2020-11-21 NOTE — Patient Instructions (Signed)

## 2020-11-21 NOTE — Telephone Encounter (Signed)
Pt c/o medication issue:  1. Name of Medication: digoxin (LANOXIN) 0.125 MG tablet  2. How are you currently taking this medication (dosage and times per day)? Patient is not taking  3. Are you having a reaction (difficulty breathing--STAT)?   4. What is your medication issue? Patient got home from her appointment today and noticed that this medication was still on her med list. She has not taken it for over a year. She wanted to have Dr. Kirtland Bouchard remove it so it doesn't accidentally get refilled

## 2020-11-21 NOTE — Progress Notes (Signed)
Cardiology Office Note:    Date:  11/21/2020   ID:  Becky Robinson, DOB 13-Jun-1940, MRN [id number]  PCP:  Hadley Pen, MD  Cardiologist:  Gypsy Balsam, MD    Referring MD: Hadley Pen, MD   Chief Complaint  Patient presents with   Follow-up  I am doing well  History of Present Illness:    Becky Robinson is a 80 y.o. female with past medical history significant for permanent atrial fibrillation, refusing anticoagulation, nonrheumatic mitral valve regurgitation, essential hypertension.  Comes today to my office for follow-up overall she says she is doing well.  She lives alone and she is happy that way.  She takes care of 12 cats.  She is a little bit tachycardic today but she tells me that she is allergic to those cats.  And that is why her heart rate is high then she tell me that she stopped all her medications including beta-blocker.  She does not have a good explanation for this she says she feels well without them.  I had a long discussion with her and I strongly recommend to go back on the medications.  He accepted my offer.  Still refused anticoagulation.  She continue aspirin she continue beta-blocker.  Past Medical History:  Diagnosis Date   A-fib [facility])    Acute right ankle pain 12/14/2017   Bilateral carpal tunnel syndrome 06/17/2018   Cardiomyopathy (HCC) 11/12/2015   Formatting of this note might be different from the original. Ejection fraction 40-45% in 2016 but 20% a few years before  EF 50-55% in summer 2017 Normal coronary arteriography in 2014   CHF (congestive heart failure) (HCC)    Depressed left ventricular ejection fraction 06/06/2019   Dermatitis 06/09/2019   Dyspnea on exertion 05/13/2018   Elevated fasting glucose 06/06/2019   Hammer toe of right foot 08/18/2016   Hyperlipidemia 06/06/2019   Hypertension    Incomplete left bundle branch block (LBBB) 06/06/2019   Metatarsalgia of right foot 08/18/2016   Nonrheumatic mitral valve regurgitation 06/06/2019    PAF (paroxysmal atrial fibrillation) (HCC) 06/06/2019   Pain and swelling of left knee 06/06/2019   Paroxysmal atrial fibrillation (HCC) 11/12/2015   Formatting of this note might be different from the original. chads score 0   Permanent atrial fibrillation (HCC) 05/13/2018   Chads 2 Vascor equals 3: Not anticoagulated as per her request   Screening for gout 12/14/2017   Ulnar neuropathy of both upper extremities 06/17/2018    Past Surgical History:  Procedure Laterality Date   ABDOMINAL HYSTERECTOMY     FOOT SURGERY     TUBAL LIGATION      Current Medications: Current Meds  Medication Sig   aspirin EC 325 MG tablet Take 325 mg by mouth daily. 10am   digoxin (LANOXIN) 0.125 MG tablet Take 1 tablet by mouth daily.   furosemide (LASIX) 20 MG tablet TAKE 1 TABLET BY MOUTH DAILY AS NEEDED FOR FLUID (EXCESSIVE SWELLING). (Patient taking differently: Take 20 mg by mouth as needed for fluid.)   Methylcobalamin (B12-ACTIVE PO) Take 1 tablet by mouth daily. Unknown strength   Multiple Vitamin (MULTIVITAMIN WITH MINERALS) TABS tablet Take 1 tablet by mouth daily. Unknown strenght   Omega-3 Fatty Acids (FISH OIL PO) Take 1 capsule by mouth at bedtime. Unknown strength   [DISCONTINUED] metoprolol succinate (TOPROL-XL) 50 MG 24 hr tablet Take 1 tablet by mouth daily.     Allergies:   Codeine   Social History  Socioeconomic History   Marital status: Married    Spouse name: Not on file   Number of children: Not on file   Years of education: Not on file   Highest education level: Not on file  Occupational History   Not on file  Tobacco Use   Smoking status: Never   Smokeless tobacco: Never  Vaping Use   Vaping Use: Never used  Substance and Sexual Activity   Alcohol use: No   Drug use: No   Sexual activity: Not on file  Other Topics Concern   Not on file  Social History Narrative   Not on file   Social Determinants of Health   Financial Resource Strain: Not on file  Food  Insecurity: Not on file  Transportation Needs: Not on file  Physical Activity: Not on file  Stress: Not on file  Social Connections: Not on file     Family History: The patient's family history includes Aneurysm in her brother, mother, and sister; Lung cancer in her father. ROS:   Please see the history of present illness.    All 14 point review of systems negative except as described per history of present illness  EKGs/Labs/Other Studies Reviewed:      Recent Labs: No results found for requested labs within last 8760 hours.  Recent Lipid Panel No results found for: CHOL, TRIG, HDL, CHOLHDL, VLDL, LDLCALC, LDLDIRECT  Physical Exam:    VS:  BP 102/66 (BP Location: Left Arm, Patient Position: Sitting)   Pulse (!) 124   Ht 5\' 8"  (1.727 m)   Wt 143 lb 3.2 oz (65 kg)   SpO2 98%   BMI 21.77 kg/m     Wt Readings from Last 3 Encounters:  11/21/20 143 lb 3.2 oz (65 kg)  06/06/19 146 lb (66.2 kg)  06/23/18 143 lb 12.8 oz (65.2 kg)     GEN:  Well nourished, well developed in no acute distress HEENT: Normal NECK: No JVD; No carotid bruits LYMPHATICS: No lymphadenopathy CARDIAC: Irregular irregular, and to, no murmurs, no rubs, no gallops RESPIRATORY:  Clear to auscultation without rales, wheezing or rhonchi  ABDOMEN: Soft, non-tender, non-distended MUSCULOSKELETAL:  No edema; No deformity  SKIN: Warm and dry LOWER EXTREMITIES: no swelling NEUROLOGIC:  Alert and oriented x 3 PSYCHIATRIC:  Normal affect   ASSESSMENT:    1. Permanent atrial fibrillation (HCC)   2. Hypertension, unspecified type   3. Mixed hyperlipidemia   4. Noncompliance with medications    PLAN:    In order of problems listed above:  Permanent atrial fibrillation rate high however she is not on any AV blocking agent.  We will put her back on beta-blocker.  Again discussion about anticoagulation was fruitless.  She still does not want to do it. Essential hypertension blood pressure seems to be  relatively well controlled continue present management. Mixed dyslipidemia.  I do have fasting lipid profile from 2019 which showed LDL at 127 HDL 66.  She does not want to take any medications for it. Overall is a quite difficult situation.  She refused to take many medications.  Hopefully this time she will continue with beta-blocker still does not want to be anticoagulated.   Medication Adjustments/Labs and Tests Ordered: Current medicines are reviewed at length with the patient today.  Concerns regarding medicines are outlined above.  Orders Placed This Encounter  Procedures   EKG 12-Lead   Medication changes:  Meds ordered this encounter  Medications   metoprolol succinate (TOPROL-XL) 50  MG 24 hr tablet    Sig: Take 1 tablet (50 mg total) by mouth daily.    Dispense:  90 tablet    Refill:  3    Signed, Georgeanna Lea, MD, [facility] 11/21/2020 10:36 AM    [facility]

## 2020-11-21 NOTE — Telephone Encounter (Signed)
Medication has been removed from her list.

## 2020-12-07 DIAGNOSIS — N3001 Acute cystitis with hematuria: Secondary | ICD-10-CM | POA: Diagnosis not present

## 2020-12-07 DIAGNOSIS — N3090 Cystitis, unspecified without hematuria: Secondary | ICD-10-CM | POA: Diagnosis not present

## 2020-12-07 DIAGNOSIS — M549 Dorsalgia, unspecified: Secondary | ICD-10-CM | POA: Diagnosis not present

## 2021-01-21 ENCOUNTER — Telehealth: Payer: Self-pay | Admitting: Cardiology

## 2021-01-21 NOTE — Telephone Encounter (Signed)
Left message on patients voicemail to please return our call.   

## 2021-01-21 NOTE — Telephone Encounter (Signed)
Patient's daughter states during last office visit the patient was told she needs testing, but she does not know what type of testing. I don't see any current orders. Is anyone able to advise on this? Patient's daughter requested that the call be returned to the patient at [phone].

## 2021-01-22 NOTE — Telephone Encounter (Signed)
Left message on patients voicemail to please return our call.   

## 2021-01-23 NOTE — Telephone Encounter (Signed)
Left message on patients voicemail to please return our call.   Letter mailed to patient at this time.  

## 2021-01-24 ENCOUNTER — Telehealth: Payer: Self-pay | Admitting: Cardiology

## 2021-01-24 MED ORDER — FUROSEMIDE 20 MG PO TABS: 20.0000 mg | ORAL_TABLET | Freq: Every day | ORAL | 3 refills | Status: DC | PRN

## 2021-01-24 NOTE — Telephone Encounter (Signed)
*  STAT* If patient is at the pharmacy, call can be transferred to refill team.   1. Which medications need to be refilled? (please list name of each medication and dose if known) furosemide (LASIX) 20 MG tablet  2. Which pharmacy/location (including street and city if local pharmacy) is medication to be sent to? [id number] - [place], [place] - [address]  3. Do they need a 30 day or 90 day supply? 90

## 2021-01-24 NOTE — Telephone Encounter (Signed)
Refill sent in per request.  

## 2021-02-20 DIAGNOSIS — H40013 Open angle with borderline findings, low risk, bilateral: Secondary | ICD-10-CM | POA: Diagnosis not present

## 2021-02-20 DIAGNOSIS — H35079 Retinal telangiectasis, unspecified eye: Secondary | ICD-10-CM | POA: Diagnosis not present

## 2021-03-01 DIAGNOSIS — S60569A Insect bite (nonvenomous) of unspecified hand, initial encounter: Secondary | ICD-10-CM | POA: Diagnosis not present

## 2021-07-09 DIAGNOSIS — R519 Headache, unspecified: Secondary | ICD-10-CM | POA: Diagnosis not present

## 2021-07-09 DIAGNOSIS — R0981 Nasal congestion: Secondary | ICD-10-CM | POA: Diagnosis not present

## 2021-07-09 DIAGNOSIS — Z20828 Contact with and (suspected) exposure to other viral communicable diseases: Secondary | ICD-10-CM | POA: Diagnosis not present

## 2021-07-09 DIAGNOSIS — R509 Fever, unspecified: Secondary | ICD-10-CM | POA: Diagnosis not present

## 2021-08-21 DIAGNOSIS — J029 Acute pharyngitis, unspecified: Secondary | ICD-10-CM | POA: Diagnosis not present

## 2021-09-24 DIAGNOSIS — Z20828 Contact with and (suspected) exposure to other viral communicable diseases: Secondary | ICD-10-CM | POA: Diagnosis not present

## 2021-09-24 DIAGNOSIS — J324 Chronic pansinusitis: Secondary | ICD-10-CM | POA: Diagnosis not present

## 2021-10-18 DIAGNOSIS — S61213A Laceration without foreign body of left middle finger without damage to nail, initial encounter: Secondary | ICD-10-CM | POA: Diagnosis not present

## 2021-11-20 DIAGNOSIS — S61213D Laceration without foreign body of left middle finger without damage to nail, subsequent encounter: Secondary | ICD-10-CM | POA: Diagnosis not present

## 2022-01-06 DIAGNOSIS — T63421A Toxic effect of venom of ants, accidental (unintentional), initial encounter: Secondary | ICD-10-CM | POA: Diagnosis not present

## 2022-01-06 DIAGNOSIS — R21 Rash and other nonspecific skin eruption: Secondary | ICD-10-CM | POA: Diagnosis not present

## 2022-02-17 DIAGNOSIS — S61411A Laceration without foreign body of right hand, initial encounter: Secondary | ICD-10-CM | POA: Diagnosis not present

## 2022-02-17 DIAGNOSIS — W5503XA Scratched by cat, initial encounter: Secondary | ICD-10-CM | POA: Diagnosis not present

## 2022-02-17 DIAGNOSIS — M79641 Pain in right hand: Secondary | ICD-10-CM | POA: Diagnosis not present

## 2022-02-18 DIAGNOSIS — W5503XA Scratched by cat, initial encounter: Secondary | ICD-10-CM | POA: Diagnosis not present

## 2022-02-18 DIAGNOSIS — S60410A Abrasion of right index finger, initial encounter: Secondary | ICD-10-CM | POA: Diagnosis not present

## 2022-02-18 DIAGNOSIS — S60412A Abrasion of right middle finger, initial encounter: Secondary | ICD-10-CM | POA: Diagnosis not present

## 2022-02-23 DIAGNOSIS — Z203 Contact with and (suspected) exposure to rabies: Secondary | ICD-10-CM | POA: Diagnosis not present

## 2022-02-23 DIAGNOSIS — Z23 Encounter for immunization: Secondary | ICD-10-CM | POA: Diagnosis not present

## 2022-02-23 DIAGNOSIS — Z2914 Encounter for prophylactic rabies immune globin: Secondary | ICD-10-CM | POA: Diagnosis not present

## 2022-02-23 DIAGNOSIS — S61200A Unspecified open wound of right index finger without damage to nail, initial encounter: Secondary | ICD-10-CM | POA: Diagnosis not present

## 2022-02-23 DIAGNOSIS — W5503XA Scratched by cat, initial encounter: Secondary | ICD-10-CM | POA: Insufficient documentation

## 2022-02-26 DIAGNOSIS — Z2914 Encounter for prophylactic rabies immune globin: Secondary | ICD-10-CM | POA: Diagnosis not present

## 2022-02-26 DIAGNOSIS — Z23 Encounter for immunization: Secondary | ICD-10-CM | POA: Diagnosis not present

## 2022-02-26 DIAGNOSIS — Z203 Contact with and (suspected) exposure to rabies: Secondary | ICD-10-CM | POA: Diagnosis not present

## 2022-04-23 DIAGNOSIS — H40013 Open angle with borderline findings, low risk, bilateral: Secondary | ICD-10-CM | POA: Diagnosis not present

## 2022-04-23 DIAGNOSIS — H35073 Retinal telangiectasis, bilateral: Secondary | ICD-10-CM | POA: Diagnosis not present

## 2022-05-08 DIAGNOSIS — S61210A Laceration without foreign body of right index finger without damage to nail, initial encounter: Secondary | ICD-10-CM | POA: Diagnosis not present

## 2022-05-08 DIAGNOSIS — M79644 Pain in right finger(s): Secondary | ICD-10-CM | POA: Diagnosis not present

## 2022-07-15 DIAGNOSIS — N3001 Acute cystitis with hematuria: Secondary | ICD-10-CM | POA: Diagnosis not present

## 2022-07-15 DIAGNOSIS — N3091 Cystitis, unspecified with hematuria: Secondary | ICD-10-CM | POA: Diagnosis not present

## 2022-08-06 ENCOUNTER — Telehealth: Payer: Self-pay | Admitting: Cardiology

## 2022-08-06 DIAGNOSIS — E559 Vitamin D deficiency, unspecified: Secondary | ICD-10-CM | POA: Diagnosis not present

## 2022-08-06 DIAGNOSIS — Z0000 Encounter for general adult medical examination without abnormal findings: Secondary | ICD-10-CM | POA: Diagnosis not present

## 2022-08-06 DIAGNOSIS — E782 Mixed hyperlipidemia: Secondary | ICD-10-CM | POA: Diagnosis not present

## 2022-08-06 DIAGNOSIS — R5381 Other malaise: Secondary | ICD-10-CM | POA: Diagnosis not present

## 2022-08-06 DIAGNOSIS — I255 Ischemic cardiomyopathy: Secondary | ICD-10-CM | POA: Diagnosis not present

## 2022-08-06 DIAGNOSIS — I482 Chronic atrial fibrillation, unspecified: Secondary | ICD-10-CM | POA: Diagnosis not present

## 2022-08-06 DIAGNOSIS — D171 Benign lipomatous neoplasm of skin and subcutaneous tissue of trunk: Secondary | ICD-10-CM | POA: Diagnosis not present

## 2022-08-06 DIAGNOSIS — R5383 Other fatigue: Secondary | ICD-10-CM | POA: Diagnosis not present

## 2022-08-06 DIAGNOSIS — I499 Cardiac arrhythmia, unspecified: Secondary | ICD-10-CM | POA: Diagnosis not present

## 2022-08-06 DIAGNOSIS — R7301 Impaired fasting glucose: Secondary | ICD-10-CM | POA: Diagnosis not present

## 2022-08-06 NOTE — Telephone Encounter (Signed)
Haley - Dr. Laurine Blazer office called in stating pt is in afib and has an abnormal EKG. Informed her Tia Alert is booked out right soonest is June. Pt has scheduled with APP at [facility]. They want to make Dr. Raliegh Ip aware that they are sending over pt's EKG.

## 2022-08-17 NOTE — Telephone Encounter (Signed)
Pt's daughter called back stating that the nurse doesn't have to call back. Pt's daughter stated that the pt only wanted to be seen in [place].

## 2022-08-17 NOTE — Telephone Encounter (Signed)
Daughter states patient wants a call back directly to discuss abnormal EKG results.

## 2022-08-18 NOTE — Telephone Encounter (Signed)
Copy of EKG from Dr. Unk Lightning is available and has been reviewed and signed by Dr. Agustin Cree. Scanned to chart.

## 2022-08-19 ENCOUNTER — Ambulatory Visit: Payer: Medicare Other | Admitting: Nurse Practitioner

## 2022-08-31 ENCOUNTER — Ambulatory Visit: Payer: Medicare Other | Admitting: Nurse Practitioner

## 2022-09-09 ENCOUNTER — Other Ambulatory Visit: Payer: Self-pay

## 2022-09-09 DIAGNOSIS — I4891 Unspecified atrial fibrillation: Secondary | ICD-10-CM | POA: Insufficient documentation

## 2022-09-17 ENCOUNTER — Encounter: Payer: Self-pay | Admitting: Cardiology

## 2022-09-17 ENCOUNTER — Ambulatory Visit: Payer: Medicare Other | Attending: Nurse Practitioner | Admitting: Cardiology

## 2022-09-17 VITALS — BP 128/74 | HR 104 | Ht 68.0 in | Wt 147.0 lb

## 2022-09-17 DIAGNOSIS — I340 Nonrheumatic mitral (valve) insufficiency: Secondary | ICD-10-CM

## 2022-09-17 DIAGNOSIS — I5022 Chronic systolic (congestive) heart failure: Secondary | ICD-10-CM | POA: Diagnosis not present

## 2022-09-17 DIAGNOSIS — I4821 Permanent atrial fibrillation: Secondary | ICD-10-CM | POA: Diagnosis not present

## 2022-09-17 DIAGNOSIS — I77819 Aortic ectasia, unspecified site: Secondary | ICD-10-CM

## 2022-09-17 DIAGNOSIS — E782 Mixed hyperlipidemia: Secondary | ICD-10-CM

## 2022-09-17 DIAGNOSIS — I10 Essential (primary) hypertension: Secondary | ICD-10-CM

## 2022-09-17 DIAGNOSIS — D6859 Other primary thrombophilia: Secondary | ICD-10-CM

## 2022-09-17 DIAGNOSIS — I5020 Unspecified systolic (congestive) heart failure: Secondary | ICD-10-CM

## 2022-09-17 MED ORDER — METOPROLOL SUCCINATE ER 100 MG PO TB24: 100.0000 mg | ORAL_TABLET | Freq: Every day | ORAL | 3 refills | Status: AC

## 2022-09-17 NOTE — Progress Notes (Addendum)
Cardiology Office Note:    Date:  09/17/2022   ID:  [name], DOB 1940/08/21, MRN [id number]  PCP:  Hadley Pen, MD   [facility] Providers Cardiologist:  Gypsy Balsam, MD     Referring MD: Hadley Pen, MD   CC: follow up abnormal EKG   History of Present Illness:    Becky Robinson is a 82 y.o. female with a hx of permanent atrial fibrillation refuses anticoagulation, cardiomyopathy, MVR, hypertension, LBBB, HLD.  Most recently she was evaluated by Dr. Bing Matter on 11/21/2020, she was doing well from a cardiac perspective although she was noted to be tachycardic, which she blamed on an allergy to her 12 cats, also she had stopped taking her beta-blocker.  She did agree to resume her beta-blocker and continue her aspirin however refused to consider anticoagulation.   Recently she was evaluated by PCP was noted to be in A-fib with RVR, heart rate 123 bpm.  Her PCP requested that she follow back up with cardiology office.  She presents today for follow-up of an abnormal EKG. repeat EKG today reveals atrial fibs heart rate 104 bpm.  She states overall she feels well, has no complaints.  She works out most days of the week and is very active in her workouts.  She denies palpitations, shortness of breath, chest pain, dyspnea, dizziness, pedal edema.    Past Medical History:  Diagnosis Date   Acute right ankle pain 12/14/2017   Bilateral carpal tunnel syndrome 06/17/2018   Capsulitis of metatarsophalangeal (MTP) joint of right foot 02/22/2018   Cardiomyopathy (HCC) 11/12/2015   Formatting of this note might be different from the original. Ejection fraction 40-45% in 2016 but 20% a few years before  EF 50-55% in summer 2017 Normal coronary arteriography in 2014   Cat scratch 02/23/2022   CHF (congestive heart failure) (HCC)    Depressed left ventricular ejection fraction 06/06/2019   Dermatitis 06/09/2019   Dyspnea on exertion 05/13/2018   Elevated fasting  glucose 06/06/2019   Hammer toe of right foot 08/18/2016   Hyperlipidemia 06/06/2019   Hypertension    Incomplete left bundle branch block (LBBB) 06/06/2019   Metatarsalgia of right foot 08/18/2016   Mild dilation of ascending aorta (HCC) 2020   Mitral valve regurgitation 2020   mild > mod   Noncompliance with medications 11/21/2020   Nonrheumatic mitral valve regurgitation 06/06/2019   Pain and swelling of left knee 06/06/2019   Permanent atrial fibrillation (HCC) 05/13/2018   Chads 2 Vascor equals 3: Not anticoagulated as per her request   Screening for gout 12/14/2017   Ulnar neuropathy of both upper extremities 06/17/2018    Past Surgical History:  Procedure Laterality Date   ABDOMINAL HYSTERECTOMY     FOOT SURGERY     TUBAL LIGATION      Current Medications: Current Meds  Medication Sig   aspirin EC 325 MG tablet Take 325 mg by mouth daily. 10am   furosemide (LASIX) 20 MG tablet Take 20 mg by mouth daily as needed for edema or fluid.   Methylcobalamin (B12-ACTIVE PO) Take 1 tablet by mouth daily. Unknown strength   metoprolol succinate (TOPROL-XL) 100 MG 24 hr tablet Take 1 tablet (100 mg total) by mouth daily. Take with or immediately following a meal.   Multiple Vitamin (MULTIVITAMIN WITH MINERALS) TABS tablet Take 1 tablet by mouth daily. Unknown strenght   Omega-3 Fatty Acids (FISH OIL PO) Take 1 capsule by mouth at bedtime. Unknown  strength   [DISCONTINUED] metoprolol succinate (TOPROL-XL) 50 MG 24 hr tablet Take 1 tablet (50 mg total) by mouth daily.     Allergies:   Codeine   Social History   Socioeconomic History   Marital status: Married    Spouse name: Not on file   Number of children: Not on file   Years of education: Not on file   Highest education level: Not on file  Occupational History   Not on file  Tobacco Use   Smoking status: Never   Smokeless tobacco: Never  Vaping Use   Vaping Use: Never used  Substance and Sexual Activity   Alcohol  use: No   Drug use: No   Sexual activity: Not on file  Other Topics Concern   Not on file  Social History Narrative   Not on file   Social Determinants of Health   Financial Resource Strain: Not on file  Food Insecurity: Not on file  Transportation Needs: Not on file  Physical Activity: Not on file  Stress: Not on file  Social Connections: Not on file     Family History: The patient's family history includes Aneurysm in her brother, mother, and sister; Lung cancer in her father.  ROS:   Please see the history of present illness.     All other systems reviewed and are negative.  EKGs/Labs/Other Studies Reviewed:    The following studies were reviewed today: Cardiac Studies & Procedures       ECHOCARDIOGRAM  ECHOCARDIOGRAM COMPLETE 06/22/2018  Narrative TRANSTHORACIC ECHOCARDIOGRAM REPORT    Patient Name:   Becky Robinson Date of Exam: 06/22/2018 Medical Rec #:  [id number]      Height:       68.0 in Accession #:    [id number]     Weight:       145.0 lb Date of Birth:  06/09/1940      BSA:          1.78 m Patient Age:    77 years       BP:           130/80 mmHg Patient Gender: F              HR:           124 bpm. Exam Location:  [place]   Procedure: 2D Echo  Indications:    Atrial Fibrillation 427.31 / I48.91  History:        Patient has no prior history of Echocardiogram examinations. Cardiomyopathy; Signs/Symptoms: Dyspnea.  Sonographer:    Louie Boston Referring Phys: [phone] ROBERT J KRASOWSKI  IMPRESSIONS   1. The left ventricle has mildly reduced systolic function of 45-50%. The cavity size is normal. There is moderately increased left ventricular wall thickness. Left ventricular diastology could not be evaluated secondary to atrial fibrillation. 2. The right ventricle has normal systolic function. The cavity in normal in size. There is no increase in right ventricular wall thickness. 3. Severely dilated left atrial size. 4. The mitral valve is  degenerative There is mild thickening. Mitral valve regurgitation is mild to moderate by color flow Doppler. 5. The aortic valve is tricuspid. There is mild thickening of the aortic valve. 6. There is mild dilatation of the ascending aorta. 7. The inferior vena cava was dilated in size with >50% respiratory variability. 8. No evidence of left ventricular regional wall motion abnormalities.  FINDINGS Left Ventricle: The left ventricle has mildly reduced systolic function of 45-50%. The cavity size  is normal. There is moderately increased left ventricular wall thickness. Left ventricular diastology could not be evaluated secondary to atrial fibrillation. No evidence of left ventricular regional wall motion abnormalities.. Right Ventricle: The right ventricle has normal systolic function. The cavity in normal in size. There is no increase in right ventricular wall thickness. Left Atrium: is severely dilated Right Atrium: is normal in size Interatrial Septum: No atrial level shunt detected by color flow Doppler.  Pericardium: There is no evidence of pericardial effusion. Mitral Valve: The mitral valve is degenerative in appearance There is mild thickening. Mitral valve regurgitation is mild to moderate by color flow Doppler.Diastolic mitral regurgitation is absent. Tricuspid Valve: The tricuspid valve is normal in structure. Tricuspid valve regurgitation is mild by color flow Doppler. Aortic Valve: The aortic valve is tricuspid. There is mild thickening of the aortic valve. Pulmonic Valve: The pulmonic valve is normal in structure. Pulmonic valve regurgitation is not visualized by color flow Doppler. No evidence of pulmonic stenosis. Aorta: There is mild dilatation of the ascending aorta. Pulmonary Artery: The pulmonary artery is of normal size and origin. Venous: The inferior vena cava was dilated in size with greater than 50% respiratory variability.  LEFT VENTRICLE PLAX 2D (Te[place])               Biplane EF (MOD) LV EF:          41.5 %       LV Biplane EF:   40.2 % LVIDd:          4.00 cm      LV A4C EF:       40.0 % LVIDs:          3.20 cm      LV A2C EF:       38.1 % LV PW:          1.20 cm LV IVS:         1.40 cm      Diastology LVOT diam:      1.70 cm      LV e' lateral:   8.87 cm/s LV SV:          29 ml        LV E/e' lateral: 18.5 LVOT Area:      2.27 cm     LV e' medial:    6.18 cm/s LV E/e' medial:  26.5 LV Volumes (MOD) LV area d, A2C:    18.90 cm LV area d, A4C:    20.70 cm LV area s, A2C:    15.10 cm LV area s, A4C:    16.30 cm LV major d, A2C:   6.03 cm LV major d, A4C:   6.48 cm LV major s, A2C:   6.28 cm LV major s, A4C:   6.48 cm LV vol d, MOD A2C: 47.5 ml LV vol d, MOD A4C: 57.3 ml LV vol s, MOD A2C: 29.4 ml LV vol s, MOD A4C: 34.4 ml LV SV MOD A2C:     18.1 ml LV SV MOD A4C:     57.3 ml LV SV MOD BP:      21.5 ml  RIGHT VENTRICLE RV S prime:     8.59 cm/s TAPSE (M-mode): 2.3 cm RVSP:           34.2 mmHg  LEFT ATRIUM             Index       RIGHT ATRIUM  Index LA diam:        4.80 cm 2.69 cm/m  RA Pressure: 8 mmHg LA Vol (A2C):   85.6 ml 48.02 ml/m RA Area:     23.70 cm LA Vol (A4C):   83.8 ml 47.01 ml/m RA Volume:   76.20 ml  42.74 ml/m LA Biplane Vol: 88.6 ml 49.70 ml/m AORTIC VALVE LVOT Vmax:   71.10 cm/s LVOT Vmean:  46.200 cm/s LVOT VTI:    0.144 m  AORTA Ao Root diam: 3.10 cm Ao Asc diam:  3.50 cm  MITRAL VALVE               TR Peak grad: 26.2 mmHg MV Area (PHT): 3.85 cm    TR Vmax:      295.00 cm/s MV PHT:        57.13 msec  RVSP:         34.2 mmHg MV Decel Time: 197 msec MV E velocity: 164.00 cm/s  IVC IVC diam: 2.10 cm   Norman Herrlich MD Electronically signed by Norman Herrlich MD Signature Date/Time: 06/22/2018/1:05:33 PM    Final             EKG:  EKG is  ordered today.  The ekg ordered today demonstrates AF, HR 104 bpm.   Recent Labs: No results found for requested labs within last 365 days.   Recent Lipid Panel No results found for: "CHOL", "TRIG", "HDL", "CHOLHDL", "VLDL", "LDLCALC", "LDLDIRECT"   Risk Assessment/Calculations:    CHA2DS2-VASc Score = 5   This indicates a 7.2% annual risk of stroke. The patient's score is based upon: CHF History: 1 HTN History: 1 Diabetes History: 0 Stroke History: 0 Vascular Disease History: 0 Age Score: 2 Gender Score: 1               Physical Exam:    VS:  BP 128/74   Pulse (!) 104   Ht 5\' 8"  (1.727 m)   Wt 147 lb (66.7 kg)   SpO2 97%   BMI 22.35 kg/m     Wt Readings from Last 3 Encounters:  09/17/22 147 lb (66.7 kg)  11/21/20 143 lb 3.2 oz (65 kg)  06/06/19 146 lb (66.2 kg)     GEN:  Well nourished, well developed in no acute distress HEENT: Normal NECK: No JVD; No carotid bruits LYMPHATICS: No lymphadenopathy CARDIAC: irregularly irregular, no murmurs, rubs, gallops RESPIRATORY:  Clear to auscultation without rales, wheezing or rhonchi  ABDOMEN: Soft, non-tender, non-distended MUSCULOSKELETAL:  No edema; No deformity  SKIN: Warm and dry NEUROLOGIC:  Alert and oriented x 3 PSYCHIATRIC:  Normal affect   ASSESSMENT:    1. Permanent atrial fibrillation (HCC)   2. Hypertension, unspecified type   3. Mixed hyperlipidemia   4. Heart failure with mildly reduced ejection fraction (HFmrEF) (HCC)   5. Aortic dilatation (HCC)   6. Mild mitral regurgitation by prior echocardiogram   7. Hypercoagulable state (HCC)    PLAN:    In order of problems listed above:  Permanent atrial fibrillation/hypercoagulable state-EKG today reveals A-fib, rate is not controlled.  She is currently on metoprolol succinate 50 mg daily, she is agreeable to increase this to 100 mg daily.  Her CHA2DS2-VASc score is 5, she was previously anticoagulated on Coumadin for approximately 11 years.  She is not willing to start Coumadin again, Xarelto, Eliquis.  She is on aspirin 325 mg daily states this is all she will take for this.  Reviewed  risk of atrial fibrillation  including stroke, death. HFmrEF-NYHA class I, euvolemic, echo on 06/22/2018 EF 45 to 50%, mild to moderate MR, mild dilatation of the ascending aorta.  Continue metoprolol 100 mg daily, continue Lasix 20 mg as needed as needed-as has not used in over a month.  She is not willing to pursue any further GDMT, and she is overall asymptomatic from a heart failure perspective. Hypertension-blood pressure 128/78 well-controlled, continue metoprolol 100 mg daily--increasing dose per above. Hyperlipidemia-has been noted to be elevated in the past however she is not interested in having it rechecked and/or managed. Ascending aorta dilatation mild at 35 mm in 2020, she is not interested in reimaging at this time. Mild to moderate MR-denies chest pain, dizziness, shortness of breath; noted on echo in 2020, she is not interested in reimaging at this time.     Disposition-increase metoprolol to 100 mg daily, return in 1 year.      Medication Adjustments/Labs and Tests Ordered: Current medicines are reviewed at length with the patient today.  Concerns regarding medicines are outlined above.  Orders Placed This Encounter  Procedures   EKG 12-Lead   Meds ordered this encounter  Medications   metoprolol succinate (TOPROL-XL) 100 MG 24 hr tablet    Sig: Take 1 tablet (100 mg total) by mouth daily. Take with or immediately following a meal.    Dispense:  90 tablet    Refill:  3    Order Specific Question:   Supervising Provider    Answer:   Georgeanna Lea [[id number]]    Patient Instructions  Medication Instructions:  Start taking metoprolol succinate 100 mg daily. Take two of your current tabs until you pick up your new bottle, then go to one pill daily.    *If you need a refill on your cardiac medications before your next appointment, please call your pharmacy*   Lab Work: None  If you have labs (blood work) drawn today and your tests are completely normal, you will  receive your results only by: MyChart Message (if you have MyChart) OR A paper copy in the mail If you have any lab test that is abnormal or we need to change your treatment, we will call you to review the results.   Testing/Procedures:    Follow-Up: At [facility], you and your health needs are our priority.  As part of our continuing mission to provide you with exceptional heart care, we have created designated Provider Care Teams.  These Care Teams include your primary Cardiologist (physician) and Advanced Practice Providers (APPs -  Physician Assistants and Nurse Practitioners) who all work together to provide you with the care you need, when you need it.  We recommend signing up for the patient portal called "MyChart".  Sign up information is provided on this After Visit Summary.  MyChart is used to connect with patients for Virtual Visits (Telemedicine).  Patients are able to view lab/test results, encounter notes, upcoming appointments, etc.  Non-urgent messages can be sent to your provider as well.   To learn more about what you can do with MyChart, go to [email or web address].    Your next appointment:   1 year(s)  Provider:   Gypsy Balsam, MD or Wallis Bamberg, NP [facility])    Other Instructions    Signed, Flossie Dibble, NP  09/17/2022 4:38 PM     [facility]

## 2022-09-17 NOTE — Patient Instructions (Signed)
Medication Instructions:  Start taking metoprolol succinate 100 mg daily. Take two of your current tabs until you pick up your new bottle, then go to one pill daily.    *If you need a refill on your cardiac medications before your next appointment, please call your pharmacy*   Lab Work: None  If you have labs (blood work) drawn today and your tests are completely normal, you will receive your results only by: MyChart Message (if you have MyChart) OR A paper copy in the mail If you have any lab test that is abnormal or we need to change your treatment, we will call you to review the results.   Testing/Procedures:    Follow-Up: At [facility], you and your health needs are our priority.  As part of our continuing mission to provide you with exceptional heart care, we have created designated Provider Care Teams.  These Care Teams include your primary Cardiologist (physician) and Advanced Practice Providers (APPs -  Physician Assistants and Nurse Practitioners) who all work together to provide you with the care you need, when you need it.  We recommend signing up for the patient portal called "MyChart".  Sign up information is provided on this After Visit Summary.  MyChart is used to connect with patients for Virtual Visits (Telemedicine).  Patients are able to view lab/test results, encounter notes, upcoming appointments, etc.  Non-urgent messages can be sent to your provider as well.   To learn more about what you can do with MyChart, go to [email or web address].    Your next appointment:   1 year(s)  Provider:   Gypsy Balsam, MD or Wallis Bamberg, NP [facility])    Other Instructions

## 2022-10-07 DIAGNOSIS — H0011 Chalazion right upper eyelid: Secondary | ICD-10-CM | POA: Diagnosis not present

## 2022-10-07 DIAGNOSIS — H40013 Open angle with borderline findings, low risk, bilateral: Secondary | ICD-10-CM | POA: Diagnosis not present

## 2022-10-07 DIAGNOSIS — H35079 Retinal telangiectasis, unspecified eye: Secondary | ICD-10-CM | POA: Diagnosis not present

## 2022-10-22 ENCOUNTER — Ambulatory Visit: Payer: Medicare Other | Admitting: Cardiology

## 2022-11-11 DIAGNOSIS — L03032 Cellulitis of left toe: Secondary | ICD-10-CM | POA: Diagnosis not present

## 2022-11-11 DIAGNOSIS — S91105A Unspecified open wound of left lesser toe(s) without damage to nail, initial encounter: Secondary | ICD-10-CM | POA: Diagnosis not present

## 2022-12-23 DIAGNOSIS — J050 Acute obstructive laryngitis [croup]: Secondary | ICD-10-CM | POA: Diagnosis not present

## 2023-02-09 DIAGNOSIS — R519 Headache, unspecified: Secondary | ICD-10-CM | POA: Diagnosis not present

## 2023-02-09 DIAGNOSIS — R070 Pain in throat: Secondary | ICD-10-CM | POA: Diagnosis not present

## 2023-02-09 DIAGNOSIS — R059 Cough, unspecified: Secondary | ICD-10-CM | POA: Diagnosis not present

## 2023-03-15 DIAGNOSIS — Z8616 Personal history of COVID-19: Secondary | ICD-10-CM | POA: Diagnosis not present

## 2023-03-15 DIAGNOSIS — I482 Chronic atrial fibrillation, unspecified: Secondary | ICD-10-CM | POA: Diagnosis not present

## 2023-03-15 DIAGNOSIS — R5383 Other fatigue: Secondary | ICD-10-CM | POA: Diagnosis not present

## 2023-03-15 DIAGNOSIS — M7989 Other specified soft tissue disorders: Secondary | ICD-10-CM | POA: Diagnosis not present

## 2023-03-15 DIAGNOSIS — E559 Vitamin D deficiency, unspecified: Secondary | ICD-10-CM | POA: Diagnosis not present

## 2023-04-05 DIAGNOSIS — N3001 Acute cystitis with hematuria: Secondary | ICD-10-CM | POA: Diagnosis not present

## 2023-04-05 DIAGNOSIS — N3091 Cystitis, unspecified with hematuria: Secondary | ICD-10-CM | POA: Diagnosis not present

## 2023-05-17 DIAGNOSIS — M7989 Other specified soft tissue disorders: Secondary | ICD-10-CM | POA: Diagnosis not present

## 2023-08-28 DIAGNOSIS — J029 Acute pharyngitis, unspecified: Secondary | ICD-10-CM | POA: Diagnosis not present

## 2023-08-28 DIAGNOSIS — H9209 Otalgia, unspecified ear: Secondary | ICD-10-CM | POA: Diagnosis not present

## 2023-10-15 DIAGNOSIS — H35343 Macular cyst, hole, or pseudohole, bilateral: Secondary | ICD-10-CM | POA: Diagnosis not present

## 2023-10-15 DIAGNOSIS — H40013 Open angle with borderline findings, low risk, bilateral: Secondary | ICD-10-CM | POA: Diagnosis not present

## 2023-12-20 DIAGNOSIS — L299 Pruritus, unspecified: Secondary | ICD-10-CM | POA: Diagnosis not present

## 2023-12-20 DIAGNOSIS — S60221A Contusion of right hand, initial encounter: Secondary | ICD-10-CM | POA: Diagnosis not present

## 2023-12-20 DIAGNOSIS — S60561A Insect bite (nonvenomous) of right hand, initial encounter: Secondary | ICD-10-CM | POA: Diagnosis not present

## 2024-01-02 DIAGNOSIS — S90212A Contusion of left great toe with damage to nail, initial encounter: Secondary | ICD-10-CM | POA: Diagnosis not present

## 2024-01-02 DIAGNOSIS — M79675 Pain in left toe(s): Secondary | ICD-10-CM | POA: Diagnosis not present

## 2024-01-07 DIAGNOSIS — I480 Paroxysmal atrial fibrillation: Secondary | ICD-10-CM | POA: Diagnosis not present

## 2024-01-07 DIAGNOSIS — B351 Tinea unguium: Secondary | ICD-10-CM | POA: Diagnosis not present

## 2024-01-09 DIAGNOSIS — I493 Ventricular premature depolarization: Secondary | ICD-10-CM | POA: Diagnosis not present

## 2024-01-09 DIAGNOSIS — I4891 Unspecified atrial fibrillation: Secondary | ICD-10-CM | POA: Diagnosis not present

## 2024-01-09 DIAGNOSIS — R9431 Abnormal electrocardiogram [ECG] [EKG]: Secondary | ICD-10-CM | POA: Diagnosis not present

## 2024-01-19 DIAGNOSIS — L608 Other nail disorders: Secondary | ICD-10-CM | POA: Diagnosis not present

## 2024-01-19 DIAGNOSIS — L603 Nail dystrophy: Secondary | ICD-10-CM | POA: Diagnosis not present

## 2024-01-19 DIAGNOSIS — S91209A Unspecified open wound of unspecified toe(s) with damage to nail, initial encounter: Secondary | ICD-10-CM | POA: Diagnosis not present

## 2024-01-19 DIAGNOSIS — B351 Tinea unguium: Secondary | ICD-10-CM | POA: Diagnosis not present
